# Patient Record
Sex: Male | Born: 1940 | Race: White | Hispanic: No | Marital: Married | Smoking: Current every day smoker
Health system: Southern US, Community
[De-identification: ages and names within clinical notes are randomized; demographics above are authoritative.]

## PROBLEM LIST (undated history)

## (undated) DIAGNOSIS — R05 Cough: Secondary | ICD-10-CM

## (undated) DIAGNOSIS — J449 Chronic obstructive pulmonary disease, unspecified: Secondary | ICD-10-CM

## (undated) DIAGNOSIS — I10 Essential (primary) hypertension: Secondary | ICD-10-CM

## (undated) DIAGNOSIS — C3490 Malignant neoplasm of unspecified part of unspecified bronchus or lung: Secondary | ICD-10-CM

## (undated) DIAGNOSIS — K219 Gastro-esophageal reflux disease without esophagitis: Secondary | ICD-10-CM

## (undated) DIAGNOSIS — M1990 Unspecified osteoarthritis, unspecified site: Secondary | ICD-10-CM

## (undated) DIAGNOSIS — R059 Cough, unspecified: Secondary | ICD-10-CM

## (undated) DIAGNOSIS — N400 Enlarged prostate without lower urinary tract symptoms: Secondary | ICD-10-CM

## (undated) DIAGNOSIS — J45909 Unspecified asthma, uncomplicated: Secondary | ICD-10-CM

## (undated) HISTORY — PX: TONSILLECTOMY: SUR1361

## (undated) HISTORY — PX: APPENDECTOMY: SHX54

## (undated) HISTORY — PX: EYE SURGERY: SHX253

---

## 2012-04-20 ENCOUNTER — Emergency Department: Payer: Self-pay | Admitting: Emergency Medicine

## 2012-04-20 LAB — COMPREHENSIVE METABOLIC PANEL
Co2: 30 mmol/L (ref 21–32)
EGFR (African American): >60
EGFR (Non-African Amer.): >60
Glucose: 111 mg/dL — ABNORMAL HIGH (ref 65–99)
Osmolality: 290 (ref 275–301)
Potassium: 4.7 mmol/L (ref 3.5–5.1)
SGPT (ALT): 25 U/L (ref 12–78)
Sodium: 141 mmol/L (ref 136–145)
Total Protein: 6.6 g/dL (ref 6.4–8.2)

## 2012-04-20 LAB — PROTIME-INR: Prothrombin Time: 13.2 secs (ref 11.5–14.7)

## 2012-04-20 LAB — CBC
HCT: 41.9 % (ref 40.0–52.0)
HGB: 14.1 g/dL (ref 13.0–18.0)
RBC: 4.49 10*6/uL (ref 4.40–5.90)

## 2012-04-20 LAB — LIPASE, BLOOD: Lipase: 165 U/L

## 2012-04-21 ENCOUNTER — Inpatient Hospital Stay: Payer: Self-pay | Admitting: Internal Medicine

## 2012-04-21 LAB — CBC
HCT: 35.4 % — ABNORMAL LOW (ref 40.0–52.0)
HGB: 11.9 g/dL — ABNORMAL LOW (ref 13.0–18.0)
MCH: 31.7 pg (ref 26.0–34.0)
MCV: 94 fL (ref 80–100)
Platelet: 264 10*3/uL (ref 150–440)
RDW: 12.6 % (ref 11.5–14.5)

## 2012-04-21 LAB — COMPREHENSIVE METABOLIC PANEL
Anion Gap: 6 — ABNORMAL LOW (ref 7–16)
BUN: 41 mg/dL — ABNORMAL HIGH (ref 7–18)
Co2: 25 mmol/L (ref 21–32)
Creatinine: 1.06 mg/dL (ref 0.60–1.30)
EGFR (African American): >60
EGFR (Non-African Amer.): >60
Glucose: 91 mg/dL (ref 65–99)
Osmolality: 291 (ref 275–301)
SGOT(AST): 31 U/L (ref 15–37)
Sodium: 141 mmol/L (ref 136–145)
Total Protein: 6.6 g/dL (ref 6.4–8.2)

## 2012-04-21 LAB — URINALYSIS, COMPLETE
Bacteria: NONE SEEN
Blood: NEGATIVE
Glucose,UR: NEGATIVE mg/dL (ref 0–75)
Leukocyte Esterase: NEGATIVE
Nitrite: NEGATIVE
Ph: 5 (ref 4.5–8.0)
Protein: NEGATIVE
RBC,UR: 1 /HPF (ref 0–5)
Specific Gravity: 1.01 (ref 1.003–1.030)
WBC UR: 1 /HPF (ref 0–5)

## 2012-04-21 LAB — LIPASE, BLOOD: Lipase: 170 U/L (ref 73–393)

## 2012-04-21 LAB — PROTIME-INR
INR: 1
Prothrombin Time: 13.7 secs (ref 11.5–14.7)

## 2012-04-21 LAB — HEMOGLOBIN: HGB: 10.2 g/dL — ABNORMAL LOW (ref 13.0–18.0)

## 2012-04-22 LAB — COMPREHENSIVE METABOLIC PANEL
BUN: 30 mg/dL — ABNORMAL HIGH (ref 7–18)
Bilirubin,Total: 0.4 mg/dL (ref 0.2–1.0)
Co2: 25 mmol/L (ref 21–32)
EGFR (African American): >60
Glucose: 85 mg/dL (ref 65–99)
Osmolality: 292 (ref 275–301)
Potassium: 4.2 mmol/L (ref 3.5–5.1)
SGOT(AST): 20 U/L (ref 15–37)
Sodium: 144 mmol/L (ref 136–145)

## 2012-04-22 LAB — AMMONIA: Ammonia, Plasma: <25 mcmol/L (ref 11–32)

## 2012-04-22 LAB — MAGNESIUM: Magnesium: 2.1 mg/dL

## 2012-04-22 LAB — HEMOGLOBIN
HGB: 9.3 g/dL — ABNORMAL LOW (ref 13.0–18.0)
HGB: 7.5 g/dL — ABNORMAL LOW (ref 13.0–18.0)

## 2012-04-23 LAB — CBC WITH DIFFERENTIAL/PLATELET
Basophil #: 0.1 10*3/uL (ref 0.0–0.1)
Basophil %: 0.8 %
Eosinophil #: 0.5 10*3/uL (ref 0.0–0.7)
Eosinophil #: 0.7 10*3/uL (ref 0.0–0.7)
Eosinophil %: 4.2 %
HCT: 26.3 % — ABNORMAL LOW (ref 40.0–52.0)
HGB: 7.5 g/dL — ABNORMAL LOW (ref 13.0–18.0)
HGB: 8.9 g/dL — ABNORMAL LOW (ref 13.0–18.0)
Lymphocyte #: 2.2 10*3/uL (ref 1.0–3.6)
Lymphocyte #: 2 10*3/uL (ref 1.0–3.6)
MCH: 29.6 pg (ref 26.0–34.0)
MCH: 29.7 pg (ref 26.0–34.0)
MCHC: 33.4 g/dL (ref 32.0–36.0)
MCHC: 34 g/dL (ref 32.0–36.0)
MCV: 88 fL (ref 80–100)
Monocyte #: 0.7 x10 3/mm (ref 0.2–1.0)
Monocyte #: 0.9 x10 3/mm (ref 0.2–1.0)
Monocyte %: 6.6 %
Neutrophil #: 7.8 10*3/uL — ABNORMAL HIGH (ref 1.4–6.5)
Platelet: 175 10*3/uL (ref 150–440)
Platelet: 163 10*3/uL (ref 150–440)
RBC: 3.01 10*6/uL — ABNORMAL LOW (ref 4.40–5.90)
RDW: 16 % — ABNORMAL HIGH (ref 11.5–14.5)
WBC: 11.3 10*3/uL — ABNORMAL HIGH (ref 3.8–10.6)
WBC: 12.8 10*3/uL — ABNORMAL HIGH (ref 3.8–10.6)

## 2012-04-23 LAB — BASIC METABOLIC PANEL
BUN: 30 mg/dL — ABNORMAL HIGH (ref 7–18)
Co2: 23 mmol/L (ref 21–32)
Creatinine: 0.84 mg/dL (ref 0.60–1.30)
EGFR (African American): >60
EGFR (Non-African Amer.): >60
Glucose: 107 mg/dL — ABNORMAL HIGH (ref 65–99)
Osmolality: 301 (ref 275–301)
Sodium: 148 mmol/L — ABNORMAL HIGH (ref 136–145)

## 2012-04-24 LAB — CBC WITH DIFFERENTIAL/PLATELET
Basophil %: 0.8 %
Eosinophil #: 0.7 10*3/uL (ref 0.0–0.7)
Eosinophil %: 5.5 %
Eosinophil %: 5 %
HCT: 24.2 % — ABNORMAL LOW (ref 40.0–52.0)
HCT: 28.4 % — ABNORMAL LOW (ref 40.0–52.0)
HGB: 9.8 g/dL — ABNORMAL LOW (ref 13.0–18.0)
Lymphocyte #: 1.5 10*3/uL (ref 1.0–3.6)
Lymphocyte #: 2.2 10*3/uL (ref 1.0–3.6)
Lymphocyte %: 16 %
MCH: 28.9 pg (ref 26.0–34.0)
MCH: 30.1 pg (ref 26.0–34.0)
MCHC: 34.7 g/dL (ref 32.0–36.0)
MCV: 87 fL (ref 80–100)
Monocyte #: 0.9 x10 3/mm (ref 0.2–1.0)
Monocyte #: 1.1 x10 3/mm — ABNORMAL HIGH (ref 0.2–1.0)
Monocyte %: 7.6 %
Neutrophil #: 8.7 10*3/uL — ABNORMAL HIGH (ref 1.4–6.5)
Neutrophil #: 9.5 10*3/uL — ABNORMAL HIGH (ref 1.4–6.5)
Neutrophil %: 73.1 %
Neutrophil %: 70.1 %
Platelet: 161 10*3/uL (ref 150–440)
RBC: 2.79 10*6/uL — ABNORMAL LOW (ref 4.40–5.90)
RDW: 16 % — ABNORMAL HIGH (ref 11.5–14.5)
RDW: 15.5 % — ABNORMAL HIGH (ref 11.5–14.5)
WBC: 11.9 10*3/uL — ABNORMAL HIGH (ref 3.8–10.6)
WBC: 13.5 10*3/uL — ABNORMAL HIGH (ref 3.8–10.6)

## 2012-04-24 LAB — BASIC METABOLIC PANEL
Anion Gap: 5 — ABNORMAL LOW (ref 7–16)
BUN: 22 mg/dL — ABNORMAL HIGH (ref 7–18)
Chloride: 120 mmol/L — ABNORMAL HIGH (ref 98–107)
Co2: 23 mmol/L (ref 21–32)
Creatinine: 0.77 mg/dL (ref 0.60–1.30)
EGFR (African American): >60
EGFR (Non-African Amer.): >60
Glucose: 86 mg/dL (ref 65–99)
Osmolality: 297 (ref 275–301)
Potassium: 3.8 mmol/L (ref 3.5–5.1)

## 2012-04-24 LAB — HEMOGLOBIN
HGB: 8.5 g/dL — ABNORMAL LOW (ref 13.0–18.0)
HGB: 9.8 g/dL — ABNORMAL LOW (ref 13.0–18.0)

## 2012-04-25 LAB — BASIC METABOLIC PANEL
BUN: 13 mg/dL (ref 7–18)
Calcium, Total: 7.4 mg/dL — ABNORMAL LOW (ref 8.5–10.1)
Chloride: 113 mmol/L — ABNORMAL HIGH (ref 98–107)
Creatinine: 0.79 mg/dL (ref 0.60–1.30)
EGFR (African American): >60
Glucose: 84 mg/dL (ref 65–99)
Osmolality: 286 (ref 275–301)

## 2012-04-25 LAB — CBC WITH DIFFERENTIAL/PLATELET
Eosinophil #: 0.5 10*3/uL (ref 0.0–0.7)
Eosinophil %: 4.7 %
HCT: 26.6 % — ABNORMAL LOW (ref 40.0–52.0)
Lymphocyte %: 13 %
MCH: 29.7 pg (ref 26.0–34.0)
MCHC: 34.2 g/dL (ref 32.0–36.0)
MCV: 87 fL (ref 80–100)
Neutrophil %: 72.3 %
Platelet: 171 10*3/uL (ref 150–440)
RBC: 3.07 10*6/uL — ABNORMAL LOW (ref 4.40–5.90)
RDW: 15.2 % — ABNORMAL HIGH (ref 11.5–14.5)
WBC: 11.2 10*3/uL — ABNORMAL HIGH (ref 3.8–10.6)

## 2012-04-25 LAB — HEMOGLOBIN
HGB: 9.3 g/dL — ABNORMAL LOW (ref 13.0–18.0)
HGB: 9 g/dL — ABNORMAL LOW (ref 13.0–18.0)

## 2012-04-26 LAB — HEMOGLOBIN: HGB: 9.2 g/dL — ABNORMAL LOW (ref 13.0–18.0)

## 2012-06-04 ENCOUNTER — Ambulatory Visit: Payer: Self-pay | Admitting: Gastroenterology

## 2013-02-02 ENCOUNTER — Ambulatory Visit: Payer: Self-pay | Admitting: Ophthalmology

## 2013-02-02 DIAGNOSIS — I499 Cardiac arrhythmia, unspecified: Secondary | ICD-10-CM

## 2013-02-15 ENCOUNTER — Ambulatory Visit: Payer: Self-pay | Admitting: Ophthalmology

## 2014-04-01 DIAGNOSIS — H01009 Unspecified blepharitis unspecified eye, unspecified eyelid: Secondary | ICD-10-CM | POA: Diagnosis not present

## 2014-06-07 DIAGNOSIS — I10 Essential (primary) hypertension: Secondary | ICD-10-CM | POA: Diagnosis not present

## 2014-06-07 DIAGNOSIS — G500 Trigeminal neuralgia: Secondary | ICD-10-CM | POA: Diagnosis not present

## 2014-06-07 DIAGNOSIS — G44009 Cluster headache syndrome, unspecified, not intractable: Secondary | ICD-10-CM | POA: Diagnosis not present

## 2014-06-13 DIAGNOSIS — M542 Cervicalgia: Secondary | ICD-10-CM | POA: Diagnosis not present

## 2014-06-13 DIAGNOSIS — B028 Zoster with other complications: Secondary | ICD-10-CM | POA: Diagnosis not present

## 2014-06-23 DIAGNOSIS — K279 Peptic ulcer, site unspecified, unspecified as acute or chronic, without hemorrhage or perforation: Secondary | ICD-10-CM | POA: Diagnosis not present

## 2014-06-23 DIAGNOSIS — J209 Acute bronchitis, unspecified: Secondary | ICD-10-CM | POA: Diagnosis not present

## 2014-06-23 DIAGNOSIS — M542 Cervicalgia: Secondary | ICD-10-CM | POA: Diagnosis not present

## 2014-06-23 DIAGNOSIS — M47812 Spondylosis without myelopathy or radiculopathy, cervical region: Secondary | ICD-10-CM | POA: Diagnosis not present

## 2014-07-08 NOTE — Consult Note (Signed)
Chief Complaint:   Subjective/Chief Complaint Covering for Dr. Gustavo Lah. No abd pain or melena/hematochezia but c/o feeling weak and hungry and wanting to go home.   VITAL SIGNS/ANCILLARY NOTES: **Vital Signs.:   08-Feb-14 04:54   Vital Signs Type Q 4hr   Temperature Temperature (F) 99   Celsius 37.2   Temperature Source Oral   Pulse Pulse 90   Respirations Respirations 18   Systolic BP Systolic BP 295   Diastolic BP (mmHg) Diastolic BP (mmHg) 71   Mean BP 90   Pulse Ox % Pulse Ox % 96   Pulse Ox Activity Level  At rest   Oxygen Delivery Room Air/ 21 %   Brief Assessment:   Cardiac Regular    Respiratory clear BS    Gastrointestinal Normal   Lab Results: Routine Chem:  08-Feb-14 04:40    Glucose, Serum 84   BUN 13   Creatinine (comp) 0.79   Sodium, Serum 144   Potassium, Serum 3.5   Chloride, Serum  113   CO2, Serum 21   Calcium (Total), Serum  7.4   Anion Gap 10   Osmolality (calc) 286   eGFR (African American) >60   eGFR (Non-African American) >60 (eGFR values <36m/min/1.73 m2 may be an indication of chronic kidney disease (CKD). Calculated eGFR is useful in patients with stable renal function. The eGFR calculation will not be reliable in acutely ill patients when serum creatinine is changing rapidly. It is not useful in  patients on dialysis. The eGFR calculation may not be applicable to patients at the low and high extremes of body sizes, pregnant women, and vegetarians.)  Routine Hem:  08-Feb-14 04:40    WBC (CBC)  11.2   RBC (CBC)  3.07   Hemoglobin (CBC)  9.1   Hematocrit (CBC)  26.6   Platelet Count (CBC) 171   MCV 87   MCH 29.7   MCHC 34.2   RDW  15.2   Neutrophil % 72.3   Lymphocyte % 13.0   Monocyte % 9.3   Eosinophil % 4.7   Basophil % 0.7   Neutrophil #  8.1   Lymphocyte # 1.5   Monocyte # 1.0   Eosinophil # 0.5   Basophil # 0.1 (Result(s) reported on 25 Apr 2012 at 05:32AM.)   Assessment/Plan:  Assessment/Plan:   Assessment  Likely duodenal ulcer.    Plan Agree with clear liquid. Pt aware that bleeding can recur as diet is advanced. Moniter hgb closely and continue PPI. thanks.   Electronic Signatures: OVerdie Shire(MD)  (Signed 08-Feb-14 09:36)  Authored: Chief Complaint, VITAL SIGNS/ANCILLARY NOTES, Brief Assessment, Lab Results, Assessment/Plan   Last Updated: 08-Feb-14 09:36 by OVerdie Shire(MD)

## 2014-07-08 NOTE — Consult Note (Signed)
Chief Complaint:   Subjective/Chief Complaint Patient seen and examined, please see full GI consult and consult note.  Patient presenting with n/v coffeground emesis and black stools.   He had noticed an increase of heartburn symptoms over the past couple weeks, black stool started sunday.  Last emesis was yesterday am, last black stool this pm.  no abdominal pain but generalized discomfort.  Positive ASA and etoh use.  Labs do not reflect cirrhosis, though this is of some concern in the clinical history.  Continue iv ppi as you are npo, serial hgb, transfuse as needed.  Will arrange for egd tomorrow pm, will need anesthesia assistance. I have discussed the risks benefits and complicatons of egd to include not limited to bleeding infection perforation and sedation and he wishes to proceed.   Electronic Signatures: Loistine Simas (MD)  (Signed 04-Feb-14 20:25)  Authored: Chief Complaint   Last Updated: 04-Feb-14 20:25 by Loistine Simas (MD)

## 2014-07-08 NOTE — Consult Note (Signed)
Brief Consult Note: Diagnosis: GI bleed.   Patient was seen by consultant.   Consult note dictated.   Comments: Appreciate consult for 74 y/o caucasian man with history of PUD 1990s, chronic tobacco and etoh use, admitted with likely GI bleed, for evaluation of the same. Patient states he was in his usual state of health until Sun night after pork barbeque dinner- started feeling unwell. Monday am woke up and passed a black stool, vomited some dark brown material that looked like coffee grounds.  States he has passed several more black stools since then. Feels fatigued and weak. [facility] admission yesterday, but agreed today.  States that he has some discomfort above the umbilicus, but denies pain. Denies nausea, heartburn, reflux, problems swallowing, and all further GI complaints. States that he started taking Pepcid yesterday after the black stool. Took and aspirin 3d ago, but denies other use of NSAIDs. Noted 2.5g hgb drop since yesterday, platelets normal, pt/inr normal. Endorses 2-3 alcoholic beverages/d. Thinks he had a colonoscopy here a couple of years ago, I do not see this record in [facility]. Had remote EGD 1990s with episode of peptic ulcer. On exam, his abdomen is soft and nontender. There is no guarding or rebound tenderness.  Impression and plan: Hematemesis and melena. Concering for UGI bleeding. Agree with serial hemoglobins and Pantoprazole gtt. Would transfuse as required. Additionally will plan for EGD tomorrow pm a clinically feasible. Indications, risks, benefits discussed with patient and he is agreeable..  Electronic Signatures: Stephens November H (NP)  (Signed 04-Feb-14 17:18)  Authored: Brief Consult Note   Last Updated: 04-Feb-14 17:18 by Theodore Demark (NP)

## 2014-07-08 NOTE — H&P (Signed)
ADDENDUM  PATIENT NAME:  James Faulkner, James Faulkner MR#:  [id number] DATE OF BIRTH:  12-04-1940  DATE OF ADMISSION:  04/20/2012  The patient is a smoker. Gave him smoking cessation counseling for about 4r minutes. The patient actually decided to leave AMA and I talked to him for a long time about staying and being observed. The patient decided to go and try to find his primary care physician to send him to do a colonoscopy and EGD. The patient understands the risk of going home with his active GI bleeding. He cannot be stopped at this moment.   TIME SPENT: I spent about 55 minutes with patient.  ____________________________ [place] Sink, MD rsg:aw D: 04/20/2012 21:31:48 ET T: 04/21/2012 08:01:06 ET JOB#: [id number]  cc: [place] Sink, MD, <Dictator> Jakyron Fabro America Brown MD ELECTRONICALLY SIGNED 04/28/2012 13:28

## 2014-07-08 NOTE — Op Note (Signed)
PATIENT NAME:  James Faulkner, James Faulkner MR#:  [id number] DATE OF BIRTH:  Jun 27, 1940  DATE OF PROCEDURE:  02/15/2013  PREOPERATIVE DIAGNOSIS:  Cataract, left eye.    POSTOPERATIVE DIAGNOSIS:  Cataract, left eye.  PROCEDURE PERFORMED: Extracapsular cataract extraction using phacoemulsification with placement of Alcon [id number], 24.0-diopter posterior chamber lens, serial number [id number].   SURGEON:  Loura Back. Kashvi Prevette, MD  ASSISTANT:  None.  ANESTHESIA: Was 4% lidocaine and 0.75% Marcaine in a 50:50 mixture with 10 units units/mL of Hylenex added, given as a peribulbar.   ANESTHESIOLOGIST: Dr. [place] Service.   COMPLICATIONS:  None.  ESTIMATED BLOOD LOSS:  Less than 1 ml.  DESCRIPTION OF PROCEDURE:  The patient was brought to the operating room and given a peribulbar block.  The patient was then prepped and draped in the usual fashion.  The vertical rectus muscles were imbricated using 5-0 silk sutures.  These sutures were then clamped to the sterile drapes as bridle sutures.  A limbal peritomy was performed extending two clock hours and hemostasis was obtained with cautery.  A partial thickness scleral groove was made at the surgical limbus and dissected anteriorly in a lamellar dissection using an Alcon crescent knife.  The anterior chamber was entered supero-temporally with a Superblade and through the lamellar dissection with a 2.6 mm keratome.  DisCoVisc was used to replace the aqueous and a continuous tear capsulorrhexis was carried out.  Hydrodissection and hydrodelineation were carried out with balanced salt and a 27 gauge canula.  The nucleus was rotated to confirm the effectiveness of the hydrodissection.  Phacoemulsification was carried out using a divide-and-conquer technique.  Total ultrasound time was 1 minute and 50 seconds with an average power of 25.2 percent. CDE of 47.51. No suture was placed.   Irrigation/aspiration was used to remove the residual cortex.  DisCoVisc was used to  inflate the capsule and the internal incision was enlarged to 3 mm with the crescent knife.  The intraocular lens was folded and inserted into the capsular bag using the AcrySert delivery system.  Irrigation/aspiration was used to remove the residual DisCoVisc.  Miostat was injected into the anterior chamber through the paracentesis track to inflate the anterior chamber and induce miosis. A tenth of a milliliter of cefuroxime was injected via the paracentesis track containing 1 mg of drug. The wound was checked for leaks and none were found. The conjunctiva was closed with cautery and the bridle sutures were removed.  Two drops of 0.3% Vigamox were placed on the eye.   An eye shield was placed on the eye.  The patient was discharged to the recovery room in good condition.   ____________________________ Loura Back Lakeyia Surber, MD sad:np D: 02/15/2013 13:27:49 ET T: 02/15/2013 15:35:31 ET JOB#: [id number]  cc: Remo Lipps A. Morgann Woodburn, MD, <Dictator> Martie Lee MD ELECTRONICALLY SIGNED 02/22/2013 8:34

## 2014-07-08 NOTE — Consult Note (Signed)
Chief Complaint:   Subjective/Chief Complaint seen for gi bleeding. Patient with several bm, black since yesterday, some decrease of hgb.  no emesis.  Patient partly sedated sue to CIWA protocol.  Denies nausea or abdominal pain. No emesis. Currently being transfused.   VITAL SIGNS/ANCILLARY NOTES: **Vital Signs.:   06-Feb-14 14:22   Vital Signs Type Blood Transfusion Complete   Temperature Temperature (F) 98.4   Celsius 36.8   Temperature Source oral   Pulse Pulse 80   Respirations Respirations 14   Systolic BP Systolic BP 287   Diastolic BP (mmHg) Diastolic BP (mmHg) 60   Mean BP 75   Pulse Ox % Pulse Ox % 96   Brief Assessment:   Cardiac Regular    Respiratory clear BS    Gastrointestinal details normal Soft  Nontender  Nondistended  No masses palpable  Bowel sounds normal   Lab Results: Routine Chem:  06-Feb-14 04:21    Glucose, Serum  107   BUN  30   Creatinine (comp) 0.84   Sodium, Serum  148   Potassium, Serum 4.1   Chloride, Serum  120   CO2, Serum 23   Calcium (Total), Serum  7.3   Anion Gap  5   Osmolality (calc) 301   eGFR (African American) >60   eGFR (Non-African American) >60 (eGFR values <18m/min/1.73 m2 may be an indication of chronic kidney disease (CKD). Calculated eGFR is useful in patients with stable renal function. The eGFR calculation will not be reliable in acutely ill patients when serum creatinine is changing rapidly. It is not useful in  patients on dialysis. The eGFR calculation may not be applicable to patients at the low and high extremes of body sizes, pregnant women, and vegetarians.)  Routine Hem:  04-Feb-14 18:21    Hemoglobin (CBC)  10.2 (Result(s) reported on 21 Apr 2012 at 07:12PM.)  05-Feb-14 01:30    Hemoglobin (CBC)  9.3 (Result(s) reported on 22 Apr 2012 at 02:07AM.)    09:55    Hemoglobin (CBC)  7.5 (Result(s) reported on 22 Apr 2012 at 10:12AM.)    21:56    Hemoglobin (CBC)  8.8 (Result(s) reported on 22 Apr 2012 at  10:09PM.)  06-Feb-14 04:21    WBC (CBC)  11.3   RBC (CBC)  2.53   Hemoglobin (CBC)  7.5   Hematocrit (CBC)  22.4   Platelet Count (CBC) 175   MCV 88   MCH 29.6   MCHC 33.4   RDW  17.6   Neutrophil % 69.3   Lymphocyte % 19.1   Monocyte % 6.6   Eosinophil % 4.2   Basophil % 0.8   Neutrophil #  7.8   Lymphocyte # 2.2   Monocyte # 0.7   Eosinophil # 0.5   Basophil # 0.1 (Result(s) reported on 23 Apr 2012 at 06:27AM.)   Assessment/Plan:  Assessment/Plan:   Assessment 1) upper gi bleeding due to large duodenal ulcer.  could not approach well on egd due to local edema and area of the C-loop affected.  Hemodynamically stable.  Rectal showing currently a thin watery/granular black effluent.    Plan 1) awaiting post-transfusion hgb.  Will discuss further with surgery.  Continue current.   Electronic Signatures: SLoistine Simas(MD)  (Signed 06-Feb-14 14:36)  Authored: Chief Complaint, VITAL SIGNS/ANCILLARY NOTES, Brief Assessment, Lab Results, Assessment/Plan   Last Updated: 06-Feb-14 14:36 by SLoistine Simas(MD)

## 2014-07-08 NOTE — H&P (Signed)
PATIENT NAME:  James Faulkner, James Faulkner MR#:  [id number] DATE OF BIRTH:  29-May-1940  DATE OF ADMISSION:  04/20/2012  REFERRING PHYSICIAN: Dr. Loletta Specter PRIMARY CARE PHYSICIAN: Dr. Lorelee Market    CHIEF COMPLAINT: Coffee-ground emesis x 2, large melanotic bowel movements x 3.   HISTORY OF PRESENT ILLNESS: The patient is a 74 year old gentleman with a history of peptic ulcer disease. Apparently, he had a severe GI bleeding episode in the mid 90s when he was transfused several units. He was very sick, in shock, but the problem resolved and he has not had any trouble since then. He sees Dr. Brunetta Genera. He is overall healthy. He is a drinker and a smoker. He has been told that he has an enlarged heart, although there has not been any work-up done for this. The patient comes today with a history of eating ribs yesterday, lots of peanuts and/or irritative foods  and alcohol yesterday for the Super Bowl.  This morning at 6:00 a.m. he woke up, he felt very dizzy, lightheaded, very weak, and went to the bathroom and had a bowel movement; and it was dark black blood mixed with some red blood. He has had 3 bowel movements so far that look the same, and they have been very large. He had coffee-ground emesis x 2 this morning. I had a long conversation with the patient.  The patient states that he would like to go home because he is not wanting to have any procedures right now.  He will follow up with his doctor for this. I recommended him to stay due to the fact that he had very large bowel movements with a lot of blood on it, and he had a history of peptic ulcer disease in the past which required multiple transfusions of blood, and the patient was in hemorrhagic shock. The patient states that he was going to think about staying and talk to his wife about it.   REVIEW OF SYSTEMS: A 12-system review is done.  CONSTITUTIONAL: Denies any fever. Positive fatigue. Positive weakness this morning. No significant weight loss or  weight gain.  EYES: No blurry vision. No double vision. No erythema.  ENT: No tinnitus. No ear pain. No postnasal drip. No sinus pain.  RESPIRATORY: No cough. No wheezing. No hemoptysis. No pneumonia.   CARDIOVASCULAR: The patient has been told that he has an enlarged heart.  The  reason that he knows that is because of a test.  I assume it was an EKG done at the office of Dr. Brunetta Genera. The patient consulted with him because of having right-sided chest soreness which he states was related to the patient using a power saw for several days at home. He was just tender, and the pain seems to start getting better. No orthopnea, no edema. No palpitations, no syncope.  GASTROINTESTINAL: Positive vomiting. Positive nausea.  Positive melena. History of  peptic ulcer disease. No GERD. No jaundice. No constipation. He has been having normal bowel movements prior to today.  GENITOURINARY: No dysuria, hematuria or changes in frequency.  ENDOCRINE: No polyuria, polydipsia, or polyphagia. No cold or heat intolerance. HEMATOLOGIC/LYMPHATIC: No previous easy bruising, anemia, or significant swollen glands.  SKIN: Without any rashes or lesions.  MUSCULOSKELETAL: No significant neck pain, back pain.  He does have shoulder pain on the right side and chest wall pain. No swollen joints. No gout.  NEUROLOGICAL: No numbness. No tingling. No significant memory loss. No CVAs. No TIAs.  PSYCHIATRIC: No insomnia, depression, or nervousness.  PAST MEDICAL HISTORY:  1. As mentioned above, possible enlarged heart.  He has been told this by his primary care physician.  2. Peptic ulcer disease in the 90s with multiple transfusions due to GI bleeding.  3. The patient states that he is healthy otherwise.   ALLERGIES: SULFA.   PAST SURGICAL HISTORY: The patient denies any surgical interventions, although on his record it states that he had an appendectomy in 2005.   FAMILY HISTORY: His mom is healthy and alive, and she is 64. No  cancer. No coronary artery disease in the family.   SOCIAL HISTORY: The patient is a smoker. He smokes 1 pack a day for over 60 years. He denies having COPD or lung problems. He is a drinker. He drinks 2 to 3 drinks a day in the afternoon. He denies ever having DTs. He is a retired Paramedic.   CURRENT MEDICATIONS: Vitamin C once daily and Pepcid AC 2 times daily.   PHYSICAL EXAMINATION: VITAL SIGNS: Blood pressure 137/68, pulse is 106, respirations 24, temperature 98.2, oxygen saturation 96% on room air.  GENERAL: The patient is alert, oriented x 3. No acute distress, no respiratory distress. Hemodynamically, he is stable.  HEENT: Pupils are equal and reactive. Extraocular movements are intact. Mucosa are moist. No icterus.  NECK: Supple. No JVD. No thyromegaly. No adenopathy. No masses.  CARDIOVASCULAR: Regular rate and rhythm, slightly tachycardic. No murmurs, rubs, or gallops. No tenderness to palpation of anterior chest wall at this moment. No displacement of PMI.  LUNGS: Clear without any wheezing or crepitus. No use of accessory muscles.  ABDOMEN: Soft, nontender, nondistended. No hepatosplenomegaly. No masses. Bowel sounds are positive. It is slightly distended and tympanic but no guarding.  GENITAL: Negative for external lesions.  EXTREMITIES: No edema, no cyanosis, no clubbing.  RECTAL: Positive guaiac on rectal exam by ER physician. I did not do a rectal exam myself.  EXTREMITIES: No edema, no cyanosis, no clubbing. Pulses +2. Capillary refill around 3 seconds.  PSYCHIATRIC: Mood is normal without any signs of significant agitation or depression.  NEUROLOGICAL: Cranial nerves II through XII intact. No focal deficits.  LYMPHATIC: Negative for lymphadenopathy in the neck or supraclavicular areas.  SKIN: Without any rashes or petechiae.   LABORATORY, DIAGNOSTIC AND RADIOLOGICAL DATA:  Glucose 111, BUN 35, sodium is 141, potassium is 4.7. His LFTs are normal. His white  count is 13.8, his hemoglobin is 14, hematocrit 41, INR is 1.0.   EKG: Normal sinus rhythm. No ST depression or elevation. No signs of LVH or chamber enlargement.   ASSESSMENT AND PLAN: This is a 74 year old gentleman with history of peptic ulcer disease and GI bleeding.   1. Melena with gastrointestinal bleeding: The patient had 3 bowel movements with a large amount of black stool and some mixed red vomiting, coffee-ground emesis. The patient is doing okay, hemodynamically stable. His hemoglobin is 14. He used to smoke here for which possibly his hemoglobin runs high anyway. He is not getting orthostatic right now, but he had significant history of bleeding before with hypovolemic shock for which we recommended to stay overnight and be at least watched. The patient does not want to do that. He wants to leave AMA. I am trying to convince him to stay. I am putting in orders for admission. The patient is considering staying.  GI consultation.  Protonix b.i.d. IV. IV fluids given. The patient is not taking any NSAIDs or aspirin or blood thinners.  2. Alcohol abuse:  The patient drinks significantly. We are going to order CIWA protocol if the patient decides to stay.  3. Increased white blood cells: This could be also related to possible diverticulitis. The patient ate a lot of peanuts and then started bleeding, might be related to the diverticular bleeding. At this moment, there are no other signs of infection; but it could be also due to stress. The patient is afebrile, and he does not have any significant abdominal pain for which I am not going to treat him with antibiotics.  We are just going to observe and wait for a GI consultation for now.   Other than that, the patient is stable but at risk of severe decompensation due to GI bleeding.  Admission orders are put in, although the patient might leave AMA.    CODE STATUS: The patient is a FULL CODE.   TIME SPENT:   About 50 minutes with this  patient. ____________________________ [place] Sink, MD rsg:cb D: 04/20/2012 15:54:46 ET T: 04/20/2012 17:02:22 ET JOB#: [id number]  cc: [place] Sink, MD, <Dictator> Myisha Pickerel America Brown MD ELECTRONICALLY SIGNED 04/28/2012 13:26

## 2014-07-08 NOTE — Consult Note (Signed)
Chief Complaint:   Subjective/Chief Complaint seen for gi bleed.  stable overnight several bloody/watery stools overnight, no abdominal pain no n/v.  patient states he is feeling better today.   VITAL SIGNS/ANCILLARY NOTES: **Vital Signs.:   07-Feb-14 07:00   Vital Signs Type Routine   Temperature Temperature (F) 98.3   Celsius 36.8   Temperature Source oral   Pulse Pulse 78   Systolic BP Systolic BP 979   Diastolic BP (mmHg) Diastolic BP (mmHg) 60   Mean BP 79   Pulse Ox % Pulse Ox % 97   Oxygen Delivery Room Air/ 21 %   Pulse Ox Heart Rate 80  *Intake and Output.:   07-Feb-14 01:11   Stool  1 large liquid stool   Brief Assessment:   Cardiac Regular    Respiratory clear BS    Gastrointestinal details normal Soft  Nontender  Nondistended  No masses palpable  Bowel sounds normal   Lab Results: Routine Chem:  06-Feb-14 04:21    BUN  30  07-Feb-14 02:42    Glucose, Serum 86   BUN  22   Creatinine (comp) 0.77   Sodium, Serum  148   Potassium, Serum 3.8   Chloride, Serum  120   CO2, Serum 23   Calcium (Total), Serum  7.3   Anion Gap  5   Osmolality (calc) 297   eGFR (African American) >60   eGFR (Non-African American) >60 (eGFR values <34m/min/1.73 m2 may be an indication of chronic kidney disease (CKD). Calculated eGFR is useful in patients with stable renal function. The eGFR calculation will not be reliable in acutely ill patients when serum creatinine is changing rapidly. It is not useful in  patients on dialysis. The eGFR calculation may not be applicable to patients at the low and high extremes of body sizes, pregnant women, and vegetarians.)  Routine Hem:  05-Feb-14 21:56    Hemoglobin (CBC)  8.8 (Result(s) reported on 22 Apr 2012 at 10:09PM.)  06-Feb-14 04:21    Hemoglobin (CBC)  7.5    19:43    Hemoglobin (CBC)  8.9  07-Feb-14 02:42    Hemoglobin (CBC)  8.0   WBC (CBC)  11.9   RBC (CBC)  2.79   Hematocrit (CBC)  24.2   Platelet Count (CBC) 161    MCV 87   MCH 28.9   MCHC 33.2   RDW  16.0   Neutrophil % 73.1   Lymphocyte % 13.0   Monocyte % 7.6   Eosinophil % 5.5   Basophil % 0.8   Neutrophil #  8.7   Lymphocyte # 1.5   Monocyte # 0.9   Eosinophil # 0.7   Basophil # 0.1 (Result(s) reported on 24 Apr 2012 at 03:38AM.)    07:47    Hemoglobin (CBC)  8.5 (Result(s) reported on 24 Apr 2012 at 08:36AM.)   Assessment/Plan:  Assessment/Plan:   Assessment 1) upper gi bleed sue to duodenal ulcer.  very difficult to approach endoscopically, no active bleeding at that time, but recueernt bleeding since.  BUN inproved this am, no bm since 1 am.  hemodynamically stable.    Plan 1) will give one unit prbc.  will tentatively plan for egd this afternoon, however will need surgical availability.   Continue current.   Electronic Signatures: SLoistine Simas(MD)  (Signed 07-Feb-14 09:34)  Authored: Chief Complaint, VITAL SIGNS/ANCILLARY NOTES, Brief Assessment, Lab Results, Assessment/Plan   Last Updated: 07-Feb-14 09:34 by SLoistine Simas(MD)

## 2014-07-08 NOTE — H&P (Signed)
PATIENT NAME:  James Faulkner, James Faulkner MR#:  [id number] DATE OF BIRTH:  Oct 05, 1940  DATE OF ADMISSION:  04/21/2012  REFERRING PHYSICIAN: Graciella Freer, MD  REASON FOR ADMISSION: Recurrent GI bleeding.   HISTORY OF PRESENT ILLNESS: James Faulkner is a very nice 74 year old gentleman who has history of peptic ulcer disease. He has been seen here yesterday after an episode of coffee ground emesis x 2 and large melanotic bowel movements x 3, on the morning of 04/20/2012. The patient was actually admitted. I saw the patient and recommended to stay here in the hospital for observation of his hemoglobin and also for possible procedure, either EGD or other type of endoscopic procedure. The patient was really not willing to stay. He decided to go home against medical advice and today he comes again because of worsening of the problem. So yesterday he had the history of watching the Super Bowl the night before, having some grapes, peanuts and some other relative foods and alcohol. The patient in the morning, at 6:00 a.m., woke up, felt very dizzy, lightheaded, very weak, went to the bathroom and had a large bowel movement with dark blood mixed with some red blood. The patient had coffee ground emesis x 2 in the morning time as well. The patient was seen. His hemoglobin was 14, although he is a smoker and has been a chronic smoker for several years for what I assume that his hemoglobin runs much higher than that. He again went home against medical advice despite the fact that we had a long conversation telling him what the risk of doing that action could be, especially in the setting that the patient already had a GI bleeding episodes in the past, in the mid 90s, where he was severely requiring several transfusions of blood and he was very sick and in hemorrhagic shock. The patient, despite all of this, decided to go home. Today he comes because this morning and last night had another two large bowel movements with blood and dark  stool, mostly large melena with some tint of blood after staying on the toilet for a little while. The patient states that today he feels much weaker, much more lightheaded and much more dizzy. The patient is admitted today. His hemoglobin has dropped from 14.1 to 11.9. His blood pressure was 158/75 and his heart rate is much higher than yesterday, at 97. He had a low temp of 99. The patient had more pain on the left side of the abdomen too.   REVIEW OF SYSTEMS:  A 12-system review of systems is done. CONSTITUTIONAL: Denies any fever. Denies any significant weight loss or weight gain. Positive fatigue. Positive weakness.  EYES: No blurry vision. No double vision. No erythema.  ENT: No tinnitus. No postnasal drip. No ear pain. No sinus pain. No difficulty swallowing.  RESPIRATORY: No wheezing. No cough. No pneumonia. No hemoptysis.  CARDIOVASCULAR: The patient states that he had dizziness, lightheadedness. He denies any significant chest pain up until the last couple of weeks when he has been using a  power saw and he has been very tender on his chest, but there is mostly musculoskeletal pain. No palpitations. No syncope. No orthopnea.  GASTROINTESTINAL: Positive vomiting. Positive nausea. Positive melena. Positive history of peptic ulcer disease. No jaundice. No constipation.  GENITOURINARY: No dysuria, hematuria or changes in frequency. ENDOCRINE: No polyuria, polydipsia or polyphagia. No cold or heat intolerance. No thyroid problems.  HEMATOLOGIC/LYMPHATIC: No bruising. No anemia. No swollen glands.  SKIN: No rashes  or lesions.  MUSCULOSKELETAL: No joint pain. No swollen joints. No gout.  NEUROLOGICAL: No numbness. No tingling. No CVAs. No TIAs.  PSYCHIATRIC: No depression. No nervousness. No insomnia.   PAST MEDICAL HISTORY: 1. Peptic ulcer disease in the 90s.  2. Gastrointestinal bleeding in the 90s with severe hemorrhagic shock.  3. Otherwise the patient is healthy. He has been told he has  an enlarged heart but there is no evidence at this moment.   ALLERGIES: The patient is allergic to SULFA, gives a rash.   PAST SURGICAL HISTORY: No surgical interventions, although in the chart it has been noted that the patient has had an appendectomy. When the patient is asked, he said "oh yes, I did have one."  FAMILY HISTORY: His mom is 28, still alive. No cancer. No coronary artery disease. No neurologic problems in the family. No GI bleeding in the family.  SOCIAL HISTORY: The patient is a smoker. He has smoked 1 pack a day for over 60 years. He denies having any COPD or other lung problems. He is a drinker. He drinks 2 to 3 drinks every night. He denies any DTs in the past. 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   CURRENT MEDICATIONS: Vitamin C and Pepcid AC.    PHYSICAL EXAMINATION: VITAL SIGNS: Blood pressure 158/75, pulse 97, respiratory rate 20, and temperature of 99.  GENERAL: The patient is alert, oriented x 3, in no acute distress. No respiratory distress. Hemodynamically is stable. He looks more tired than he looked yesterday, more withdrawn. ENT: Pupils are equal and reactive. Extraocular movements are intact. Mucosa moist. Anicteric sclerae. Pale conjunctivae.  NECK: Supple. No JVD. No thyromegaly. No adenopathy. No masses. No rigidity.  HEART: Regular rate and rhythm. No murmurs, rubs or gallops are appreciated at this moment. No tenderness to palpation to anterior chest wall. No displacement of PMI.  LUNGS: Clear without any crepitus or wheezing. The patient is not using any accessory muscles.  ABDOMEN: Soft, nontender and nondistended. On the epigastric area, he has some mild tenderness at the level of the left quadrant but only with deep palpation. The patient states that it was not hurting much prior to that. The patient has a distended abdomen, tympanic, but no guarding or rebound.  GENITAL: Negative for external lesions.  RECTAL: Positive guaiac yesterday. We  did not repeat it today.  EXTREMITIES: No edema, no cyanosis and no clubbing. Pulses +2. Capillary refill less than 3 seconds.  PSYCHIATRIC: His mood is normal without any agitation, alert and oriented x 3.  NEUROLOGIC: Cranial nerves II through XII intact. No focal deficits.  LYMPHATIC: Negative for lymphadenopathy in neck or supraclavicular areas.  SKIN: No rashes or petechiae.  MUSCULOSKELETAL: No significant joint deformities or joint edema.   LABS/DIAGNOSTIC STUDIES: BUN 41, creatinine 1.06, chloride 110. Hemoglobin is 11.9 from 14.1 yesterday. White count is 13.6 and hematocrit 35. Red blood cells in urine are only 1 and white blood cells are 1.   EKG: Tachycardia. No ST depression or elevation.   ASSESSMENT AND PLAN:  1. Melena with gastrointestinal bleeding. As mentioned, the patient was here yesterday. There were admission orders done, but the patient decided to leave against medical advice despite long counseling. His hemoglobin was 14.1, today is 11.9. The patient is more dizzy, more lightheaded, more symptomatic. He is tachycardic today. This is likely to be related to peptic ulcer disease, but the patient had a lot of peanuts and could also be related to  diverticular bleeding. Yesterday he did not have any fever and today he had a slight fever around 99 and he had also elevated white blood count, a little bit higher than yesterday, at 13.6, for what I am going to start antibiotics for coverage of possible diverticulitis. The patient does not have any significant pain at this moment for what I am not going to do a CT scan of the abdomen, just followup on labs.  2. Alcohol abuse. The patient drinks significantly. I will put him on CIWA protocol today. 3. Increased white blood cells. This could be related to diverticulosis, diverticulitis. 4. Increased BUN. This is likely due to gastrointestinal bleeding.  5. Gastroenterology consultation ordered.   CODE STATUS: FULL CODE.   TIME  SPENT: I spent about 35 minutes with this admission. ____________________________ Playita Cortada Sink, MD rsg:sb D: 04/21/2012 13:31:49 ET T: 04/21/2012 13:53:02 ET JOB#: [id number]  cc: Hastings Sink, MD, <Dictator> Shenise Wolgamott America Brown MD ELECTRONICALLY SIGNED 04/28/2012 13:48

## 2014-07-08 NOTE — Consult Note (Signed)
PATIENT NAME:  James Faulkner, James Faulkner MR#:  [id number] DATE OF BIRTH:  11/03/1940  DATE OF CONSULTATION:  04/21/2012  REFERRING PHYSICIAN:  Ste. Genevieve Sink, MD   CONSULTING PHYSICIAN:  Theodore Demark, NP  REASON FOR CONSULTATION: Appreciate consult for this 74 year old Caucasian man with history of peptic ulcer disease in the 1990s, chronic tobacco and alcohol use, admitted with likely GI bleed for the evaluation of the same.  This consult was ordered by Dr. Laurin Coder. The patient states he was in his usual state of health until Sunday night after a pork barbecue dinner, started feeling unwell Monday morning, woke up and passed a black stool, vomited some dark brown material that looked like coffee grounds. He states he has passed several more black stools since then, last was today while he was in the Emergency Department.  He states he feels fatigued and weak, declined admission yesterday but agreed today. He states that he has some discomfort above the umbilicus but denies pain, denies heartburn, reflux, nausea, problems swallowing, and all further GI complaints. He states that he started taking Pepcid yesterday after he noticed the black stool. He took an aspirin three days ago but denies other use of Nonsteroidal anti-inflammatory drugs. Noted 2.5 hemoglobin drop since yesterday, platelets normal, PT-INR normal.  He endorses 2 to 3 alcoholic beverages a day.  He thinks he had colonoscopy a couple of years ago. I do not see this record in [facility]. He had remote EGD in the 1990s with an episode of peptic ulcer.   PAST MEDICAL HISTORY: Peptic ulcer disease in 1990, pelvic fracture, remote history as a young man, this repaired without surgery.   ALLERGIES: SULFA.   PAST SURGICAL HISTORY: Appendectomy, tonsillectomy.   FAMILY HISTORY: Negative for colorectal cancer, colon polyps, liver disease, ulcers.   SOCIAL HISTORY: A 60 pack-year cigarette smoking history. He endorses 2 to 3 alcoholic  beverages every night. No history of DTs. 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 No illicits. He lives with wife. Prior history of packed red blood cell transfusions which he tolerated well and has had no known complications.   CURRENT MEDICATIONS: Vitamin C, Pepcid p.r.n., Tylenol p.r.n.  REVIEW OF SYSTEMS: A 10-point review was done and is unremarkable other than what is written above.   LABORATORY, DIAGNOSTIC AND RADIOLOGICAL DATA:  Most recent: BUN 41, creatinine 1.06, sodium 141, potassium 4.4, GFR greater than 60, calcium 8.8, lipase 170. Liver panel normal. WBC 13.6, hemoglobin 11.9, hematocrit 35.4, platelets 264. Red cells normocytic. PT 13.7, INR 1.0.    Has a normal EKG per chart note.   PHYSICAL EXAMINATION: VITAL SIGNS: Most recent vital signs: Temperature 98.7, pulse 77, respiratory rate 18, blood pressure 132/73, SaO2 97% on room air.  GENERAL: A well-appearing Caucasian man lying in bed in no acute distress.  PSYCHIATRIC: Pleasant, calm, cooperative, responds appropriately, mood stable.  HEENT: Normocephalic, atraumatic. Conjunctivae pink. Oral mucous membranes pink. No scleral icterus. No redness, drainage, or inflammation to the eyes or the nares.  NECK: Supple, no thyromegaly, JVD, adenopathy.  CARDIAC: S1, S2, regular rate and rhythm. No MRG. Peripheral pulses 2+. No appreciable edema.  RESPIRATORY: Respirations eupneic. Lungs are CTAB.  ABDOMEN: Nondistended. Bowel sounds x 4, very soft. No tenderness appreciated. No guarding, rigidity, peritoneal signs, hepatosplenomegaly  or masses noted.  GENITOURINARY: Deferred.  RECTAL: Positive guaiac yesterday. I did not repeat.  EXTREMITIES: MAEW x 4. No clubbing, cyanosis, or edema. Strength 5 out of 5.  NEUROLOGICAL: Alert, oriented x 3. Cranial nerves  II through XII are grossly intact. Speech clear. No facial droop.  SKIN: Warm, dry, pink. No erythema, lesion or rash   IMPRESSION AND PLAN: Hematemesis with melena, drop in hemoglobin  concerning for upper GI bleeding. Agree with serial hemoglobins, pantoprazole drip.  Would transfuse as required. Additionally, we will plan for endoscopy tomorrow afternoon as clinically feasible. I discussed the indications, risks and benefits with the patient, and he is agreeable.      These services were provided by Stephens November, MSN, [facility] in collaboration with Lollie Sails, MD with whom I have discussed this patient in full.   Thank you for this consult.   ____________________________ Theodore Demark, NP chl:cb D: 04/21/2012 17:17:39 ET T: 04/21/2012 17:58:10 ET JOB#: [id number]  cc: Theodore Demark, NP, <Dictator> [place] SIGNED 04/27/2012 6:33

## 2014-07-08 NOTE — Consult Note (Signed)
Chief Complaint:  Subjective/Chief Complaint Sick of eating liquid diet. Wants solids. No bleeding. Hgb stable. No BM. Abd distension.   VITAL SIGNS/ANCILLARY NOTES: **Vital Signs.:   09-Feb-14 06:00  Vital Signs Type Q 4hr  Temperature Temperature (F) 98.4  Celsius 36.8  Temperature Source oral  Pulse Pulse 86  Respirations Respirations 18  Systolic BP Systolic BP 196  Diastolic BP (mmHg) Diastolic BP (mmHg) 66  Mean BP 91  Pulse Ox % Pulse Ox % 95  Pulse Ox Activity Level  At rest  Oxygen Delivery Room Air/ 21 %   Brief Assessment:  Cardiac Regular   Respiratory clear BS   Gastrointestinal distended. active bowel sounds.   Lab Results: Routine Chem:  08-Feb-14 04:40   Glucose, Serum 84  BUN 13  Creatinine (comp) 0.79  Sodium, Serum 144  Potassium, Serum 3.5  Chloride, Serum  113  CO2, Serum 21  Calcium (Total), Serum  7.4  Anion Gap 10  Osmolality (calc) 286  eGFR (African American) >60  eGFR (Non-African American) >60 (eGFR values <38m/min/1.73 m2 may be an indication of chronic kidney disease (CKD). Calculated eGFR is useful in patients with stable renal function. The eGFR calculation will not be reliable in acutely ill patients when serum creatinine is changing rapidly. It is not useful in  patients on dialysis. The eGFR calculation may not be applicable to patients at the low and high extremes of body sizes, pregnant women, and vegetarians.)  Routine Hem:  08-Feb-14 04:40   Hemoglobin (CBC)  9.1  WBC (CBC)  11.2  RBC (CBC)  3.07  Hematocrit (CBC)  26.6  Platelet Count (CBC) 171  MCV 87  MCH 29.7  MCHC 34.2  RDW  15.2  Neutrophil % 72.3  Lymphocyte % 13.0  Monocyte % 9.3  Eosinophil % 4.7  Basophil % 0.7  Neutrophil #  8.1  Lymphocyte # 1.5  Monocyte # 1.0  Eosinophil # 0.5  Basophil # 0.1 (Result(s) reported on 25 Apr 2012 at 05:32AM.)   Assessment/Plan:  Assessment/Plan:  Assessment Large duodenal ulcer. Stable on meds.   Plan Order  abd series today. If ok, then try low residue diet by tonight. Dr. SGustavo Lahwill see patient tomorrow. Thanks.   Electronic Signatures: OVerdie Shire(MD)  (Signed 09-Feb-14 10:08)  Authored: Chief Complaint, VITAL SIGNS/ANCILLARY NOTES, Brief Assessment, Lab Results, Assessment/Plan   Last Updated: 09-Feb-14 10:08 by OVerdie Shire(MD)

## 2014-07-08 NOTE — Consult Note (Signed)
Chief Complaint:   Subjective/Chief Complaint called to see patient due to recurrent hematemesis.  patient is hemodynamically stable m material that is in the kidney basin at bedside is small volume and appears to be expectorated.  Patietn is more nervous and I am concerned that he may be beginning withdrawl from etoh.  Large black stool this am noted.  BUN improved.   Recommend transfer to ICU, start octreotide.   I am trying to get coverage to do egd sooner.  Paging rounding internist.   VITAL SIGNS/ANCILLARY NOTES: **Vital Signs.:   05-Feb-14 08:32   Vital Signs Type Q 4hr   Temperature Temperature (F) 97.7   Celsius 36.5   Temperature Source Oral   Pulse Pulse 83   Respirations Respirations 19   Systolic BP Systolic BP 638   Diastolic BP (mmHg) Diastolic BP (mmHg) 75   Mean BP 94   Pulse Ox % Pulse Ox % 98   Pulse Ox Activity Level  At rest   Oxygen Delivery Room Air/ 21 %    08:35   Vital Signs Type Q 4hr   Pulse Pulse 86   Pulse source if not from Vital Sign Device per Telemetry Clerk   Telemetry pattern Cardiac Rhythm Normal sinus rhythm; pattern reported by Telemetry Clerk   Electronic Signatures: Loistine Simas (MD)  (Signed 05-Feb-14 09:47)  Authored: Chief Complaint, VITAL SIGNS/ANCILLARY NOTES   Last Updated: 05-Feb-14 09:47 by Loistine Simas (MD)

## 2014-07-08 NOTE — Consult Note (Signed)
Chief Complaint:   Subjective/Chief Complaint patient seen for upper gi bleeding.  patietn stable without recurrent melena today.  no n/v or abdominal pain, wants to go home, very little insight. On ciwa.   VITAL SIGNS/ANCILLARY NOTES: **Vital Signs.:   07-Feb-14 16:21   Vital Signs Type Upon Transfer   Pulse Pulse 76   Systolic BP Systolic BP 160   Diastolic BP (mmHg) Diastolic BP (mmHg) 68   Mean BP 93   Pulse Ox % Pulse Ox % 97   Pulse Ox Activity Level  At rest   Oxygen Delivery Room Air/ 21 %   Pulse Ox Heart Rate 78  *Intake and Output.:   07-Feb-14 01:11   Stool  1 large liquid stool   Brief Assessment:   Cardiac Regular    Respiratory clear BS    Gastrointestinal details normal Soft  Nontender  Nondistended  No masses palpable  Bowel sounds normal   Lab Results: Routine Chem:  06-Feb-14 04:21    BUN  30  07-Feb-14 02:42    Glucose, Serum 86   BUN  22   Creatinine (comp) 0.77   Sodium, Serum  148   Potassium, Serum 3.8   Chloride, Serum  120   CO2, Serum 23   Calcium (Total), Serum  7.3   Anion Gap  5   Osmolality (calc) 297   eGFR (African American) >60   eGFR (Non-African American) >60 (eGFR values <63m/min/1.73 m2 may be an indication of chronic kidney disease (CKD). Calculated eGFR is useful in patients with stable renal function. The eGFR calculation will not be reliable in acutely ill patients when serum creatinine is changing rapidly. It is not useful in  patients on dialysis. The eGFR calculation may not be applicable to patients at the low and high extremes of body sizes, pregnant women, and vegetarians.)  Routine Hem:  07-Feb-14 02:42    WBC (CBC)  11.9   RBC (CBC)  2.79   Hemoglobin (CBC)  8.0   Hematocrit (CBC)  24.2   Platelet Count (CBC) 161   MCV 87   MCH 28.9   MCHC 33.2   RDW  16.0   Neutrophil % 73.1   Lymphocyte % 13.0   Monocyte % 7.6   Eosinophil % 5.5   Basophil % 0.8   Neutrophil #  8.7   Lymphocyte # 1.5   Monocyte  # 0.9   Eosinophil # 0.7   Basophil # 0.1 (Result(s) reported on 24 Apr 2012 at 03:38AM.)    07:47    Hemoglobin (CBC)  8.5 (Result(s) reported on 24 Apr 2012 at 08:36AM.)    11:48    WBC (CBC)  13.5   RBC (CBC)  3.27   Hemoglobin (CBC)  9.8   Hematocrit (CBC)  28.4   Platelet Count (CBC) 163   MCV 87   MCH 30.1   MCHC 34.7   RDW  15.5   Neutrophil % 70.1   Lymphocyte % 16.0   Monocyte % 8.3   Eosinophil % 5.0   Basophil % 0.6   Neutrophil #  9.5   Lymphocyte # 2.2   Monocyte #  1.1   Eosinophil # 0.7   Basophil # 0.1 (Result(s) reported on 24 Apr 2012 at 12:07PM.)   Assessment/Plan:  Assessment/Plan:   Assessment 1) upper gi bleed due to large DU-currently stable.   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  Appreciate surgery assistance.  DR Oh rounding over the weekend.   Electronic Signatures: Loistine Simas (MD)  (Signed 07-Feb-14 16:35)  Authored: Chief Complaint, VITAL SIGNS/ANCILLARY NOTES, Brief Assessment, Lab Results, Assessment/Plan   Last Updated: 07-Feb-14 16:35 by Loistine Simas (MD)

## 2014-07-08 NOTE — Consult Note (Signed)
Chief Complaint:   Subjective/Chief Complaint seen for melena. large  melena stool shortly ago.  blood transfusing.  denies abdominal apin, note episode this of possible small amount of hematemesis versus hemoptysis.   VITAL SIGNS/ANCILLARY NOTES: **Vital Signs.:   05-Feb-14 12:51   Vital Signs Type Pre-Blood   Temperature Temperature (F) 98.1   Celsius 36.7   Temperature Source oral   Pulse Pulse 87   Respirations Respirations 21   Systolic BP Systolic BP 502   Diastolic BP (mmHg) Diastolic BP (mmHg) 52   Mean BP 68   Pulse Ox % Pulse Ox % 95   Oxygen Delivery Room Air/ 21 %  *Intake and Output.:   05-Feb-14 10:00   Stool  large dark liquid stool   Brief Assessment:   Cardiac Regular    Respiratory clear BS    Gastrointestinal details normal Soft  Nontender  Nondistended  No masses palpable  Bowel sounds normal   Lab Results: Hepatic:  05-Feb-14 01:30    Bilirubin, Total 0.4   Alkaline Phosphatase 56   SGPT (ALT) 19   SGOT (AST) 20   Total Protein, Serum  5.3   Albumin, Serum  2.8  Routine Chem:  05-Feb-14 01:30    Glucose, Serum 85   BUN  30   Creatinine (comp) 0.87   Sodium, Serum 144   Potassium, Serum 4.2   Chloride, Serum  114   CO2, Serum 25   Calcium (Total), Serum  8.0   Osmolality (calc) 292   eGFR (African American) >60   eGFR (Non-African American) >60 (eGFR values <59m/min/1.73 m2 may be an indication of chronic kidney disease (CKD). Calculated eGFR is useful in patients with stable renal function. The eGFR calculation will not be reliable in acutely ill patients when serum creatinine is changing rapidly. It is not useful in  patients on dialysis. The eGFR calculation may not be applicable to patients at the low and high extremes of body sizes, pregnant women, and vegetarians.)   Anion Gap  5   Magnesium, Serum 2.1 (1.8-2.4 THERAPEUTIC RANGE: 4-7 mg/dL TOXIC: > 10 mg/dL  -----------------------)  Routine Hem:  05-Feb-14 01:30     Hemoglobin (CBC)  9.3 (Result(s) reported on 22 Apr 2012 at 02:07AM.)    09:55    Hemoglobin (CBC)  7.5 (Result(s) reported on 22 Apr 2012 at 10:12AM.)   Assessment/Plan:  Assessment/Plan:   Assessment 1) melena-recurrent, in the setting of h/o nsaids, prior h/o pudz, daily etoh use.    Plan 1) egd today, I have discussed the risks benefits and complications of egd to include not limited to bleeding infection perofration and sedattion and he wishes to proceed.   Electronic Signatures: SLoistine Simas(MD)  (Signed 05-Feb-14 13:30)  Authored: Chief Complaint, VITAL SIGNS/ANCILLARY NOTES, Brief Assessment, Lab Results, Assessment/Plan   Last Updated: 05-Feb-14 13:30 by SLoistine Simas(MD)

## 2014-07-08 NOTE — Discharge Summary (Signed)
PATIENT NAME:  James Faulkner, James Faulkner MR#:  [id number] DATE OF BIRTH:  1940-05-16  DATE OF ADMISSION:  04/21/2012 DATE OF DISCHARGE:  04/27/2012  DISCHARGE DIAGNOSES:  Duodenal ulcers with gastrointestinal bleed, alcohol abuse, acute blood loss anemia.   PRIMARY CARE PHYSICIAN:  Nonlocal.  PRIMARY GASTROENTEROLOGIST:  Dr. Loistine Simas.   HISTORY OF PRESENT ILLNESS:  The patient is a 74 year old man with history of peptic ulcer disease, was in ER the day before presentation after an episode of coffee-ground vomiting 2 times and large melanotic bowel movement for 3 times on the morning of the presentation, was actually admitted.  The patient was advised to stay for observation in the hospital and followup of his hemoglobin and endoscopy, but he was not willing to stay.  He decided to go home [place] and he came back again because of worsening of the problem. So on February 3rd, which was the next day of the Superbowl after he had party of Superbowl while eating grapes, peanuts and alcohol with friends and relatives, next day on February 3rd he woke up at 6:00 a.m., felt very dizzy, lightheaded, very weak and had a large bowel movement with dark blood mixed with some red blood, coffee-ground vomiting 2 times in the morning and came to the Emergency Room.  He was a chronic smoker for several years and hemoglobin was around 14.  He already had GI bleeding episode in the past in 90s where he required several transfusion of the blood and was very sick and hemorrhagic shock, so on February 3rd after going home he again had large bowel movement with blood and dark stools and he felt much weaker and lightheaded and more dizzy, so decided to come to the Emergency Room.  Hemoglobin dropped from 14.1 to 11.9.  His blood pressure was 158/75 and heart rate was 97.   HOSPITAL COURSE AND STAY:  For his GI bleed, GI consult was done.  He was admitted with nothing by mouth, IV PPI drip.  Endoscopy was done by Dr.  Gustavo Lah which shows exam of the esophagus were normal, old blood, a small amount of coffee ground noted in the esophagus.  The entire examination of stomach was normal.  Coffee-ground material noted throughout the gastric vault.  No evidence of bleeding lesion.  One partially obstructing nonbleeding duodenal ulcer with visible vessel was found in the second to third part of the duodenum.  This lesion was ulceration, size a millimeter in largest diameter and also could be visualized with difficulty due to local edema.  No active bleeding.  So initially he was on octreotide drip, but then discontinued and he was just continued on pantoprazole drip.  Surgery consult was also called in for this and surgeons note that initially he was too unstable for OR, but if he becomes stable he might need further surgical workup, but later on patient's hemoglobin remained stable after transfusion and he started tolerating diet slowly, so surgeon signed off the case without any surgical workup done in this hospital course.  He totally required 5 PRBC transfusion during this hospital stay.   OTHER MEDICAL ISSUES ADDRESSED DURING THE HOSPITAL:   1.  Anemia of acute blood loss.  His hemoglobin which was 14 before the episode which came down to 7.5 on February 5th which again dropped, after transfusion it went up and then dropped again.  Totally he received 5 units of PRBC transfusion and then his hemoglobin remained stable around the range of 9.  2.  Leukocytosis, possibly this was reactive due to acute blood loss episode, but he was also started on ciprofloxacin for possibility of gastroenteritis of anything and then after a few days it stopped.  3.  Dehydration due to GI bleed and not able to eat.  He was continued on normal saline IV fluids and he improved.  4.  Hypernatremia.  He was started on D5 half normal saline and that resolved.  5.  Alcohol abuse.  He was monitored on CIWA protocol while he was in the hospital and he  did not need much medication for that.   CONSULTATIONS DURING THE HOSPITAL:  Dr. Sherri Rad for surgical consult.  Dr. Loistine Simas for GI consult.   [place]:  Glucose 111, BUN 35, creatinine 1.06, sodium 141, potassium 4.7, chloride 109, CO2 30, WBC 13.8, hemoglobin 14.1, MCV 93, lipase 165, INR 1.0, hemoglobin dropped to 7.5 and on discharge hemoglobin stable, more than 9 for 48 hours.   CONDITION ON DISCHARGE:  Stable.   CODE STATUS:  FULL CODE.  MEDICATIONS ADVISED ON DISCHARGE:  Ferrous sulfate 325 mg oral tablet 3 times a day, pantoprazole 40 mg oral delayed-release tablet 2 times a day.   HOME OXYGEN ON DISCHARGE:  No.   DIET ON DISCHARGE:  Regular with diet consistency mechanical soft.   SPECIAL INSTRUCTIONS:  Eat low residue diet, mild, less spicy, less oily, less fibrous, absolutely no alcohol.  No pain medication.  No aspirin.     ACTIVITY LIMITATION:  As tolerated.   TIMEFRAME TO FOLLOW UP:  1 to 2 weeks with Dr. Gustavo Lah.    TOTAL TIME SPENT IN THIS DISCHARGE:  45 minutes.     ____________________________ Ceasar Lund Anselm Jungling, MD vgv:ea D: 04/30/2012 22:48:29 ET T: 05/01/2012 00:58:42 ET JOB#: [id number]  cc: Ceasar Lund. Anselm Jungling, MD, <Dictator> Lollie Sails, MD   Vaughan Basta MD ELECTRONICALLY SIGNED 05/08/2012 18:06

## 2014-07-08 NOTE — Consult Note (Signed)
PATIENT NAME:  James Faulkner, James Faulkner MR#:  [id number] DATE OF BIRTH:  1940-11-02  DATE OF CONSULTATION:  04/23/2012  REFERRING PHYSICIAN:   CONSULTING PHYSICIAN:  Amoria Mclees A. Marina Gravel, MD  REASON FOR CONSULTATION:  Upper GI bleed, duodenal ulcer.   HISTORY:  This is a 74 year old white male admitted to the Intensive Care Unit on February 4th with upper GI bleed. Admission hemoglobin was 11.2. He had been seen the previous day in the Emergency Room with hemoglobin of 14.1. Over the course of the February 4th, the patient had a drop of his hemoglobin from 11.9 to 10.2. On the morning of February 5th, it was 7.5. He received 2 units of blood and it was 8.8, this was at 2100 last night. This morning at 4:30 despite being hemodynamically stable, the patient had hemoglobin of 7.5 and platelet count of 125,000. He has now completed unit #4. A hemoglobin is pending. The patient did have a large maroon stool earlier today. The patient has remained hemodynamically stable. An upper endoscopy was performed by Dr. Gustavo Lah yesterday which demonstrated a large ulcer seen in the duodenum with visible vessel. The ulcer appeared to be partially obstructing. No active bleeding was seen. The patient does have a strong history of chronic alcoholism.  In speaking with his wife she says he drinks very little.   He does admit to some occasional NSAID use. He does relate a previous history of significant upper GI hemorrhage in the 1990s requiring multiple blood transfusions. He has had no surgical intervention for duodenal ulcer.   ALLERGIES:  SULFA.   MEDICATIONS:  At present include Protonix drip, IV fluids, blood transfusion which just finished, Cipro, Ativan, Flagyl, morphine, Zofran, Phenergan, Ambien, Ativan per CIWA protocol.   PAST MEDICAL HISTORY:  As described above.   PAST SURGICAL HISTORY:  Of the abdomen is negative.   PHYSICAL EXAMINATION: GENERAL: The patient is alert and oriented.  VITAL SIGNS: Temperature 98.1, pulse  81, blood pressure 125/58, room air saturation 98%.  LUNGS: Clear.  HEART: Regular rate and rhythm.  ABDOMEN: Soft and nontender. No scars. No hernias.  EXTREMITIES: Warm and well perfused   DIAGNOSTIC DATA:  Laboratory values as described above. Endoscopy as described above.  No good pictures are available of the ulcer.   IMPRESSION:  Upper gastrointestinal hemorrhage for which he is now on his 4 th unit of blood. Post transfusion hemoglobin is pending. The patient has remained hemodynamically stable. I suspect if he is continuing to be hemodynamically stable, a repeat endoscopy if he continues to bleed would be of benefit. If he becomes hemodynamically unstable then laparotomy with duodenostomy and oversew of the ulcer with or without vagotomy would need to be entertained. I discussed this with the patient as well as Dr. Gustavo Lah by telephone.    ____________________________ Jeannette How. Marina Gravel, MD mab:si D: 04/23/2012 18:22:10 ET T: 04/23/2012 20:20:52 ET JOB#: [id number]  cc: Elta Guadeloupe A. Marina Gravel, MD, <Dictator> Hortencia Conradi MD ELECTRONICALLY SIGNED 04/24/2012 9:17

## 2014-07-12 DIAGNOSIS — W57XXXA Bitten or stung by nonvenomous insect and other nonvenomous arthropods, initial encounter: Secondary | ICD-10-CM | POA: Diagnosis not present

## 2014-07-12 DIAGNOSIS — M25521 Pain in right elbow: Secondary | ICD-10-CM | POA: Diagnosis not present

## 2014-07-14 DIAGNOSIS — H409 Unspecified glaucoma: Secondary | ICD-10-CM | POA: Diagnosis not present

## 2014-07-14 DIAGNOSIS — W57XXXA Bitten or stung by nonvenomous insect and other nonvenomous arthropods, initial encounter: Secondary | ICD-10-CM | POA: Diagnosis not present

## 2014-07-14 DIAGNOSIS — L03119 Cellulitis of unspecified part of limb: Secondary | ICD-10-CM | POA: Diagnosis not present

## 2014-08-12 DIAGNOSIS — Z720 Tobacco use: Secondary | ICD-10-CM | POA: Diagnosis not present

## 2014-08-12 DIAGNOSIS — I119 Hypertensive heart disease without heart failure: Secondary | ICD-10-CM | POA: Diagnosis not present

## 2014-08-12 DIAGNOSIS — Z789 Other specified health status: Secondary | ICD-10-CM | POA: Diagnosis not present

## 2014-08-12 DIAGNOSIS — J410 Simple chronic bronchitis: Secondary | ICD-10-CM | POA: Diagnosis not present

## 2014-08-16 DIAGNOSIS — I119 Hypertensive heart disease without heart failure: Secondary | ICD-10-CM | POA: Diagnosis not present

## 2014-08-17 DIAGNOSIS — I10 Essential (primary) hypertension: Secondary | ICD-10-CM | POA: Diagnosis not present

## 2014-08-17 DIAGNOSIS — E119 Type 2 diabetes mellitus without complications: Secondary | ICD-10-CM | POA: Diagnosis not present

## 2014-08-18 DIAGNOSIS — H4011X0 Primary open-angle glaucoma, stage unspecified: Secondary | ICD-10-CM | POA: Diagnosis not present

## 2014-08-19 DIAGNOSIS — I119 Hypertensive heart disease without heart failure: Secondary | ICD-10-CM | POA: Diagnosis not present

## 2014-08-19 DIAGNOSIS — J3489 Other specified disorders of nose and nasal sinuses: Secondary | ICD-10-CM | POA: Diagnosis not present

## 2014-08-19 DIAGNOSIS — H409 Unspecified glaucoma: Secondary | ICD-10-CM | POA: Diagnosis not present

## 2014-08-25 DIAGNOSIS — H2511 Age-related nuclear cataract, right eye: Secondary | ICD-10-CM | POA: Diagnosis not present

## 2014-10-19 DIAGNOSIS — H5711 Ocular pain, right eye: Secondary | ICD-10-CM | POA: Diagnosis not present

## 2014-10-19 DIAGNOSIS — I119 Hypertensive heart disease without heart failure: Secondary | ICD-10-CM | POA: Diagnosis not present

## 2014-10-26 DIAGNOSIS — Z720 Tobacco use: Secondary | ICD-10-CM | POA: Diagnosis not present

## 2014-10-26 DIAGNOSIS — I119 Hypertensive heart disease without heart failure: Secondary | ICD-10-CM | POA: Diagnosis not present

## 2014-10-26 DIAGNOSIS — J410 Simple chronic bronchitis: Secondary | ICD-10-CM | POA: Diagnosis not present

## 2014-10-26 DIAGNOSIS — H409 Unspecified glaucoma: Secondary | ICD-10-CM | POA: Diagnosis not present

## 2015-01-05 DIAGNOSIS — I119 Hypertensive heart disease without heart failure: Secondary | ICD-10-CM | POA: Diagnosis not present

## 2015-01-05 DIAGNOSIS — R05 Cough: Secondary | ICD-10-CM | POA: Diagnosis not present

## 2015-01-05 DIAGNOSIS — M5440 Lumbago with sciatica, unspecified side: Secondary | ICD-10-CM | POA: Diagnosis not present

## 2015-01-05 DIAGNOSIS — R42 Dizziness and giddiness: Secondary | ICD-10-CM | POA: Diagnosis not present

## 2015-01-05 DIAGNOSIS — M539 Dorsopathy, unspecified: Secondary | ICD-10-CM | POA: Diagnosis not present

## 2015-01-05 DIAGNOSIS — J0301 Acute recurrent streptococcal tonsillitis: Secondary | ICD-10-CM | POA: Diagnosis not present

## 2015-01-05 DIAGNOSIS — J309 Allergic rhinitis, unspecified: Secondary | ICD-10-CM | POA: Diagnosis not present

## 2015-01-09 ENCOUNTER — Other Ambulatory Visit: Payer: Self-pay | Admitting: Internal Medicine

## 2015-01-09 ENCOUNTER — Ambulatory Visit
Admission: RE | Admit: 2015-01-09 | Discharge: 2015-01-09 | Disposition: A | Discharge status: Home / Self Care | Payer: Medicare Other | Source: Ambulatory Visit | Attending: Internal Medicine | Admitting: Internal Medicine

## 2015-01-09 ENCOUNTER — Ambulatory Visit
Admission: RE | Admit: 2015-01-09 | Discharge: 2015-01-09 | Disposition: A | Discharge status: Home / Self Care | Payer: Medicare Other | Source: Ambulatory Visit | Attending: Cardiology | Admitting: Cardiology

## 2015-01-09 DIAGNOSIS — J449 Chronic obstructive pulmonary disease, unspecified: Secondary | ICD-10-CM | POA: Insufficient documentation

## 2015-01-09 DIAGNOSIS — R079 Chest pain, unspecified: Secondary | ICD-10-CM | POA: Diagnosis not present

## 2015-01-26 DIAGNOSIS — Z23 Encounter for immunization: Secondary | ICD-10-CM | POA: Diagnosis not present

## 2015-01-26 DIAGNOSIS — I119 Hypertensive heart disease without heart failure: Secondary | ICD-10-CM | POA: Diagnosis not present

## 2015-02-22 DIAGNOSIS — H2511 Age-related nuclear cataract, right eye: Secondary | ICD-10-CM | POA: Diagnosis not present

## 2015-02-23 DIAGNOSIS — Z2821 Immunization not carried out because of patient refusal: Secondary | ICD-10-CM | POA: Diagnosis not present

## 2015-02-23 DIAGNOSIS — Z0000 Encounter for general adult medical examination without abnormal findings: Secondary | ICD-10-CM | POA: Diagnosis not present

## 2015-02-23 DIAGNOSIS — Z720 Tobacco use: Secondary | ICD-10-CM | POA: Diagnosis not present

## 2015-02-24 DIAGNOSIS — H2511 Age-related nuclear cataract, right eye: Secondary | ICD-10-CM | POA: Diagnosis not present

## 2015-02-27 ENCOUNTER — Encounter: Payer: Self-pay | Admitting: *Deleted

## 2015-03-06 ENCOUNTER — Ambulatory Visit: Payer: Medicare Other | Admitting: Anesthesiology

## 2015-03-06 ENCOUNTER — Encounter: Payer: Self-pay | Admitting: *Deleted

## 2015-03-06 ENCOUNTER — Ambulatory Visit: Admission: RE | Admit: 2015-03-06 | Payer: Medicare Other | Source: Ambulatory Visit | Admitting: Ophthalmology

## 2015-03-06 ENCOUNTER — Ambulatory Visit
Admission: RE | Admit: 2015-03-06 | Discharge: 2015-03-06 | Disposition: A | Discharge status: Home / Self Care | Payer: Medicare Other | Source: Ambulatory Visit | Attending: Ophthalmology | Admitting: Ophthalmology

## 2015-03-06 ENCOUNTER — Encounter
Admission: RE | Disposition: A | Discharge status: Home / Self Care | Payer: Self-pay | Source: Ambulatory Visit | Attending: Ophthalmology

## 2015-03-06 ENCOUNTER — Encounter: Admission: RE | Payer: Self-pay | Source: Ambulatory Visit

## 2015-03-06 DIAGNOSIS — K219 Gastro-esophageal reflux disease without esophagitis: Secondary | ICD-10-CM | POA: Diagnosis not present

## 2015-03-06 DIAGNOSIS — H2511 Age-related nuclear cataract, right eye: Secondary | ICD-10-CM | POA: Diagnosis not present

## 2015-03-06 DIAGNOSIS — J449 Chronic obstructive pulmonary disease, unspecified: Secondary | ICD-10-CM | POA: Diagnosis not present

## 2015-03-06 DIAGNOSIS — M1990 Unspecified osteoarthritis, unspecified site: Secondary | ICD-10-CM | POA: Diagnosis not present

## 2015-03-06 DIAGNOSIS — Z9842 Cataract extraction status, left eye: Secondary | ICD-10-CM | POA: Diagnosis not present

## 2015-03-06 DIAGNOSIS — R05 Cough: Secondary | ICD-10-CM | POA: Insufficient documentation

## 2015-03-06 DIAGNOSIS — I10 Essential (primary) hypertension: Secondary | ICD-10-CM | POA: Diagnosis not present

## 2015-03-06 DIAGNOSIS — Z8711 Personal history of peptic ulcer disease: Secondary | ICD-10-CM | POA: Insufficient documentation

## 2015-03-06 DIAGNOSIS — F17200 Nicotine dependence, unspecified, uncomplicated: Secondary | ICD-10-CM | POA: Diagnosis not present

## 2015-03-06 DIAGNOSIS — Z882 Allergy status to sulfonamides status: Secondary | ICD-10-CM | POA: Insufficient documentation

## 2015-03-06 HISTORY — DX: Gastro-esophageal reflux disease without esophagitis: K21.9

## 2015-03-06 HISTORY — DX: Benign prostatic hyperplasia without lower urinary tract symptoms: N40.0

## 2015-03-06 HISTORY — DX: Chronic obstructive pulmonary disease, unspecified: J44.9

## 2015-03-06 HISTORY — DX: Cough, unspecified: R05.9

## 2015-03-06 HISTORY — DX: Cough: R05

## 2015-03-06 HISTORY — PX: CATARACT EXTRACTION W/PHACO: SHX586

## 2015-03-06 HISTORY — DX: Unspecified osteoarthritis, unspecified site: M19.90

## 2015-03-06 HISTORY — DX: Essential (primary) hypertension: I10

## 2015-03-06 SURGERY — PHACOEMULSIFICATION, CATARACT, WITH IOL INSERTION
Anesthesia: Monitor Anesthesia Care | Site: Eye | Laterality: Right | Wound class: Clean

## 2015-03-06 SURGERY — PHACOEMULSIFICATION, CATARACT, WITH IOL INSERTION
Anesthesia: Choice | Laterality: Right

## 2015-03-06 MED ORDER — CEFUROXIME OPHTHALMIC INJECTION 1 MG/0.1 ML
INJECTION | OPHTHALMIC | Status: AC
  Filled 2015-03-06: qty 0.1

## 2015-03-06 MED ORDER — SODIUM CHLORIDE 0.9 % IV SOLN
INTRAVENOUS | Status: DC
  Administered 2015-03-06: 07:00:00 via INTRAVENOUS

## 2015-03-06 MED ORDER — TETRACAINE HCL 0.5 % OP SOLN
OPHTHALMIC | Status: DC | PRN
  Administered 2015-03-06: 1 [drp] via OPHTHALMIC

## 2015-03-06 MED ORDER — EPINEPHRINE HCL 1 MG/ML IJ SOLN
INTRAMUSCULAR | Status: AC
  Filled 2015-03-06: qty 1

## 2015-03-06 MED ORDER — LIDOCAINE HCL (PF) 4 % IJ SOLN
INTRAOCULAR | Status: DC | PRN
  Administered 2015-03-06: .5 mL via OPHTHALMIC

## 2015-03-06 MED ORDER — TETRACAINE HCL 0.5 % OP SOLN
OPHTHALMIC | Status: AC
  Filled 2015-03-06: qty 2

## 2015-03-06 MED ORDER — LIDOCAINE HCL (PF) 4 % IJ SOLN
INTRAMUSCULAR | Status: AC
  Filled 2015-03-06: qty 10

## 2015-03-06 MED ORDER — MOXIFLOXACIN HCL 0.5 % OP SOLN
1.0000 [drp] | OPHTHALMIC | Status: AC | PRN
  Administered 2015-03-06 (×3): 1 [drp] via OPHTHALMIC

## 2015-03-06 MED ORDER — NA CHONDROIT SULF-NA HYALURON 40-17 MG/ML IO SOLN
INTRAOCULAR | Status: AC
  Filled 2015-03-06: qty 1

## 2015-03-06 MED ORDER — BUPIVACAINE HCL (PF) 0.75 % IJ SOLN
INTRAMUSCULAR | Status: AC
  Filled 2015-03-06: qty 10

## 2015-03-06 MED ORDER — ALFENTANIL 500 MCG/ML IJ INJ
INJECTION | INTRAMUSCULAR | Status: DC | PRN
  Administered 2015-03-06: 500 ug via INTRAVENOUS

## 2015-03-06 MED ORDER — NA CHONDROIT SULF-NA HYALURON 40-17 MG/ML IO SOLN
INTRAOCULAR | Status: DC | PRN
  Administered 2015-03-06: 1 mL via INTRAOCULAR

## 2015-03-06 MED ORDER — CEFUROXIME OPHTHALMIC INJECTION 1 MG/0.1 ML
INJECTION | OPHTHALMIC | Status: DC | PRN
  Administered 2015-03-06: .1 mL via INTRACAMERAL

## 2015-03-06 MED ORDER — ATROPINE SULFATE 1 % OP SOLN
OPHTHALMIC | Status: AC
  Filled 2015-03-06: qty 5

## 2015-03-06 MED ORDER — CYCLOPENTOLATE HCL 2 % OP SOLN
OPHTHALMIC | Status: AC
  Administered 2015-03-06: 1 [drp] via OPHTHALMIC
  Filled 2015-03-06: qty 2

## 2015-03-06 MED ORDER — PHENYLEPHRINE HCL 10 % OP SOLN
OPHTHALMIC | Status: AC
  Administered 2015-03-06: 1 [drp] via OPHTHALMIC
  Filled 2015-03-06: qty 5

## 2015-03-06 MED ORDER — BUPIVACAINE HCL (PF) 0.75 % IJ SOLN
INTRAMUSCULAR | Status: DC | PRN
  Administered 2015-03-06: 4 mL via OPHTHALMIC

## 2015-03-06 MED ORDER — MOXIFLOXACIN HCL 0.5 % OP SOLN
OPHTHALMIC | Status: AC
  Administered 2015-03-06: 1 [drp] via OPHTHALMIC
  Filled 2015-03-06: qty 3

## 2015-03-06 MED ORDER — HYALURONIDASE HUMAN 150 UNIT/ML IJ SOLN
INTRAMUSCULAR | Status: AC
  Filled 2015-03-06: qty 1

## 2015-03-06 MED ORDER — CARBACHOL 0.01 % IO SOLN
INTRAOCULAR | Status: DC | PRN
  Administered 2015-03-06: .5 mL via INTRAOCULAR

## 2015-03-06 MED ORDER — PHENYLEPHRINE HCL 10 % OP SOLN
1.0000 [drp] | OPHTHALMIC | Status: AC | PRN
  Administered 2015-03-06 (×4): 1 [drp] via OPHTHALMIC

## 2015-03-06 MED ORDER — MOXIFLOXACIN HCL 0.5 % OP SOLN
OPHTHALMIC | Status: DC | PRN
  Administered 2015-03-06: 1 [drp] via OPHTHALMIC

## 2015-03-06 MED ORDER — EPINEPHRINE HCL 1 MG/ML IJ SOLN
INTRAOCULAR | Status: DC | PRN
  Administered 2015-03-06: 1 mL via OPHTHALMIC

## 2015-03-06 MED ORDER — MIDAZOLAM HCL 2 MG/2ML IJ SOLN
INTRAMUSCULAR | Status: DC | PRN
  Administered 2015-03-06: 1 mg via INTRAVENOUS

## 2015-03-06 MED ORDER — CYCLOPENTOLATE HCL 2 % OP SOLN
1.0000 [drp] | OPHTHALMIC | Status: AC | PRN
  Administered 2015-03-06 (×4): 1 [drp] via OPHTHALMIC

## 2015-03-06 SURGICAL SUPPLY — 30 items
CANNULA ANT/CHMB 27GA (MISCELLANEOUS) ×3 IMPLANT
CORD BIP STRL DISP 12FT (MISCELLANEOUS) ×3 IMPLANT
CUP MEDICINE 2OZ PLAST GRAD ST (MISCELLANEOUS) ×3 IMPLANT
DRAPE XRAY CASSETTE 23X24 (DRAPES) ×3 IMPLANT
ERASER HMR WETFIELD 18G (MISCELLANEOUS) ×3 IMPLANT
GLOVE BIO SURGEON STRL SZ8 (GLOVE) ×3 IMPLANT
GLOVE SURG LX 6.5 MICRO (GLOVE) ×2
GLOVE SURG LX 8.0 MICRO (GLOVE) ×2
GLOVE SURG LX STRL 6.5 MICRO (GLOVE) ×1 IMPLANT
GLOVE SURG LX STRL 8.0 MICRO (GLOVE) ×1 IMPLANT
GOWN STRL REUS W/ TWL LRG LVL3 (GOWN DISPOSABLE) ×1 IMPLANT
GOWN STRL REUS W/ TWL XL LVL3 (GOWN DISPOSABLE) ×1 IMPLANT
GOWN STRL REUS W/TWL LRG LVL3 (GOWN DISPOSABLE) ×2
GOWN STRL REUS W/TWL XL LVL3 (GOWN DISPOSABLE) ×2
LENS IOL ACRSF IQ ULTRA 24.0 (Intraocular Lens) ×1 IMPLANT
LENS IOL ACRYSOF IQ 24.0 (Intraocular Lens) ×3 IMPLANT
PACK CATARACT (MISCELLANEOUS) ×3 IMPLANT
PACK CATARACT DINGLEDEIN LX (MISCELLANEOUS) ×3 IMPLANT
PACK EYE AFTER SURG (MISCELLANEOUS) ×3 IMPLANT
SHLD EYE VISITEC  UNIV (MISCELLANEOUS) ×3 IMPLANT
SOL BSS BAG (MISCELLANEOUS) ×3
SOL PREP PVP 2OZ (MISCELLANEOUS) ×3
SOLUTION BSS BAG (MISCELLANEOUS) ×1 IMPLANT
SOLUTION PREP PVP 2OZ (MISCELLANEOUS) ×1 IMPLANT
SUT SILK 5-0 (SUTURE) ×3 IMPLANT
SYR 3ML LL SCALE MARK (SYRINGE) ×3 IMPLANT
SYR 5ML LL (SYRINGE) ×3 IMPLANT
SYR TB 1ML 27GX1/2 LL (SYRINGE) ×3 IMPLANT
WATER STERILE IRR 1000ML POUR (IV SOLUTION) ×3 IMPLANT
WIPE NON LINTING 3.25X3.25 (MISCELLANEOUS) ×3 IMPLANT

## 2015-03-06 NOTE — Anesthesia Postprocedure Evaluation (Signed)
Anesthesia Post Note  Patient: James Faulkner  Procedure(s) Performed: Procedure(s) (LRB): CATARACT EXTRACTION PHACO AND INTRAOCULAR LENS PLACEMENT ([facility]) (Right)  Patient location during evaluation: Short Stay Anesthesia Type: MAC Level of consciousness: awake and alert Pain management: pain level controlled Vital Signs Assessment: post-procedure vital signs reviewed and stable Respiratory status: spontaneous breathing Cardiovascular status: blood pressure returned to baseline Anesthetic complications: no    Last Vitals:  Filed Vitals:   03/06/15 0855 03/06/15 0857  BP: 158/68 158/68  Pulse: 60 60  Temp: 36.6 C 36.6 C  Resp: 16 16    Last Pain:  Filed Vitals:   03/06/15 0857  PainSc: 1                  Brantley Fling

## 2015-03-06 NOTE — Interval H&P Note (Signed)
History and Physical Interval Note:  03/06/2015 7:22 AM  James Faulkner  has presented today for surgery, with the diagnosis of cataract  The various methods of treatment have been discussed with the patient and family. After consideration of risks, benefits and other options for treatment, the patient has consented to  Procedure(s): CATARACT EXTRACTION PHACO AND INTRAOCULAR LENS PLACEMENT ([place]) (Right) as a surgical intervention .  The patient's history has been reviewed, patient examined, no change in status, stable for surgery.  I have reviewed the patient's chart and labs.  Questions were answered to the patient's satisfaction.     James Faulkner

## 2015-03-06 NOTE — Anesthesia Preprocedure Evaluation (Signed)
Anesthesia Evaluation  Patient identified by MRN, date of birth, ID band Patient awake    Reviewed: Allergy & Precautions, NPO status , Patient's Chart, lab work & pertinent test results, reviewed documented beta blocker date and time   Airway Mallampati: II  TM Distance: >3 FB     Dental  (+) Chipped   Pulmonary COPD, Current Smoker,           Cardiovascular hypertension, Pt. on medications      Neuro/Psych    GI/Hepatic GERD  ,  Endo/Other    Renal/GU      Musculoskeletal  (+) Arthritis ,   Abdominal   Peds  Hematology   Anesthesia Other Findings   Reproductive/Obstetrics                             Anesthesia Physical Anesthesia Plan  ASA: III  Anesthesia Plan: MAC   Post-op Pain Management:    Induction: Intravenous  Airway Management Planned:   Additional Equipment:   Intra-op Plan:   Post-operative Plan:   Informed Consent: I have reviewed the patients History and Physical, chart, labs and discussed the procedure including the risks, benefits and alternatives for the proposed anesthesia with the patient or authorized representative who has indicated his/her understanding and acceptance.     Plan Discussed with: CRNA  Anesthesia Plan Comments:         Anesthesia Quick Evaluation

## 2015-03-06 NOTE — Discharge Instructions (Signed)
Eye Surgery Discharge Instructions  Expect mild scratchy sensation or mild soreness. DO NOT RUB YOUR EYE!  The day of surgery:  Minimal physical activity, but bed rest is not required  No reading, computer work, or close hand work  No bending, lifting, or straining.  May watch TV  For 24 hours:  No driving, legal decisions, or alcoholic beverages  Safety precautions  Eat anything you prefer: It is better to start with liquids, then soup then solid foods.  _____ Eye patch should be worn until postoperative exam tomorrow.  ____ Solar shield eyeglasses should be worn for comfort in the sunlight/patch while sleeping  Resume all regular medications including aspirin or Coumadin if these were discontinued prior to surgery. You may shower, bathe, shave, or wash your hair. Tylenol may be taken for mild discomfort.  Call your doctor if you experience significant pain, nausea, or vomiting, fever > 101 or other signs of infection. [phone] or [phone] Specific instructions:  Follow-up Information    Follow up with Davinder Haff, MD In 1 day.   Specialty:  Ophthalmology   Why:  For wound re-check @ 10:00   Contact information:   [address]   [place] [address] [phone]

## 2015-03-06 NOTE — Op Note (Signed)
Date of Surgery: 03/06/2015 Date of Dictation: 03/06/2015 8:52 AM Pre-operative Diagnosis:  Nuclear Sclerotic Cataract right Eye Post-operative Diagnosis: same Procedure performed: Extra-capsular Cataract Extraction (ECCE) with placement of a posterior chamber intraocular lens (IOL) right Eye IOL:  Implant Name Type Inv. Item Serial No. Manufacturer Lot No. LRB No. Used  LENS IOL ACRYSOF IQ 24.0 - [id number] Intraocular Lens LENS IOL ACRYSOF IQ 24.0 [id number] ALCON   Right 1   Anesthesia: 2% Lidocaine and 4% Marcaine in a 50/50 mixture with 10 unites/ml of Hylenex given as a peribulbar Anesthesiologist: Anesthesiologist: Gunnar Bulla, MD CRNA: Rolla Plate, CRNA Complications: none Estimated Blood Loss: less than 1 ml  Description of procedure:  The patient was given anesthesia and sedation via intravenous access. The patient was then prepped and draped in the usual fashion. A 25-gauge needle was bent for initiating the capsulorhexis. A 5-0 silk suture was placed through the conjunctiva superior and inferiorly to serve as bridle sutures. Hemostasis was obtained at the superior limbus using an eraser cautery. A partial thickness groove was made at the anterior surgical limbus with a 64 Beaver blade and this was dissected anteriorly with an [employer]. The anterior chamber was entered at 10 o'clock with a 1.0 mm paracentesis knife and through the lamellar dissection with a 2.6 mm Alcon keratome. Epi-Shugarcaine 0.5 CC [9 cc BSS Plus (Alcon), 3 cc 4% preservative-free lidocaine (Hospira) and 4 cc 1:1000 preservative-free, bisulfite-free epinephrine] was injected into the anterior chamber via the paracentesis tract. Epi-Shugarcaine 0.5 CC [9 cc BSS Plus (Alcon), 3 cc 4% preservative-free lidocaine (Hospira) and 4 cc 1:1000 preservative-free, bisulfite-free epinephrine] was injected into the anterior chamber via the paracentesis tract. DiscoVisc was injected to replace the aqueous and a  continuous tear curvilinear capsulorhexis was performed using a bent 25-gauge needle.  Balance salt on a syringe was used to perform hydro-dissection and phacoemulsification was carried out using a divide and conquer technique. Procedure(s) with comments: CATARACT EXTRACTION PHACO AND INTRAOCULAR LENS PLACEMENT ([facility]) (Right) - [place] 02:02 AP% 26.6 CDE 64.53 fluid pack lot # [id number]. Irrigation/aspiration was used to remove the residual cortex and the capsular bag was inflated with DiscoVisc. The intraocular lens was inserted into the capsular bag using a pre-loaded UltraSert Delivery System. Irrigation/aspiration was used to remove the residual DiscoVisc. The wound was inflated with balanced salt and checked for leaks. None were found. Miostat was injected via the paracentesis track and 0.1 ml of cefuroxime containing 1 mg of drug  was injected via the paracentesis track. The wound was checked for leaks again and none were found.   The bridal sutures were removed and two drops of Vigamox were placed on the eye. An eye shield was placed to protect the eye and the patient was discharged to the recovery area in good condition.   Keierra Nudo MD

## 2015-03-06 NOTE — H&P (Signed)
  See scanned notes.

## 2015-03-06 NOTE — Transfer of Care (Signed)
Immediate Anesthesia Transfer of Care Note  Patient: James Faulkner  Procedure(s) Performed: Procedure(s) with comments: CATARACT EXTRACTION PHACO AND INTRAOCULAR LENS PLACEMENT ([facility]) (Right) - [place] 02:02 AP% 26.6 CDE 64.53 fluid pack lot # [id number]  Patient Location: Short Stay  Anesthesia Type:MAC  Level of Consciousness: awake and alert   Airway & Oxygen Therapy: Patient Spontanous Breathing  Post-op Assessment: Report given to RN  Post vital signs: Reviewed  Last Vitals:  Filed Vitals:   03/06/15 0855 03/06/15 0857  BP: 158/68 158/68  Pulse: 60 60  Temp: 36.6 C 36.6 C  Resp: 16 16    Complications: No apparent anesthesia complications

## 2015-07-24 DIAGNOSIS — J410 Simple chronic bronchitis: Secondary | ICD-10-CM | POA: Diagnosis not present

## 2015-07-24 DIAGNOSIS — Z720 Tobacco use: Secondary | ICD-10-CM | POA: Diagnosis not present

## 2015-07-24 DIAGNOSIS — I119 Hypertensive heart disease without heart failure: Secondary | ICD-10-CM | POA: Diagnosis not present

## 2015-09-07 DIAGNOSIS — J410 Simple chronic bronchitis: Secondary | ICD-10-CM | POA: Diagnosis not present

## 2015-09-07 DIAGNOSIS — I119 Hypertensive heart disease without heart failure: Secondary | ICD-10-CM | POA: Diagnosis not present

## 2015-10-12 DIAGNOSIS — H40053 Ocular hypertension, bilateral: Secondary | ICD-10-CM | POA: Diagnosis not present

## 2015-10-23 DIAGNOSIS — J410 Simple chronic bronchitis: Secondary | ICD-10-CM | POA: Diagnosis not present

## 2015-10-23 DIAGNOSIS — I119 Hypertensive heart disease without heart failure: Secondary | ICD-10-CM | POA: Diagnosis not present

## 2015-10-23 DIAGNOSIS — Z720 Tobacco use: Secondary | ICD-10-CM | POA: Diagnosis not present

## 2015-10-23 DIAGNOSIS — H409 Unspecified glaucoma: Secondary | ICD-10-CM | POA: Diagnosis not present

## 2015-11-24 ENCOUNTER — Other Ambulatory Visit: Payer: Self-pay | Admitting: Otolaryngology

## 2015-11-24 DIAGNOSIS — R070 Pain in throat: Secondary | ICD-10-CM

## 2015-11-24 DIAGNOSIS — R42 Dizziness and giddiness: Secondary | ICD-10-CM | POA: Diagnosis not present

## 2015-11-24 DIAGNOSIS — R1310 Dysphagia, unspecified: Secondary | ICD-10-CM

## 2015-11-24 DIAGNOSIS — H9201 Otalgia, right ear: Secondary | ICD-10-CM

## 2015-11-30 ENCOUNTER — Ambulatory Visit: Payer: Medicare Other

## 2015-12-04 ENCOUNTER — Ambulatory Visit
Admission: RE | Admit: 2015-12-04 | Discharge: 2015-12-04 | Disposition: A | Discharge status: Home / Self Care | Payer: Medicare Other | Source: Ambulatory Visit | Attending: Otolaryngology | Admitting: Otolaryngology

## 2015-12-04 DIAGNOSIS — M47892 Other spondylosis, cervical region: Secondary | ICD-10-CM | POA: Insufficient documentation

## 2015-12-04 DIAGNOSIS — J439 Emphysema, unspecified: Secondary | ICD-10-CM | POA: Insufficient documentation

## 2015-12-04 DIAGNOSIS — I700 Atherosclerosis of aorta: Secondary | ICD-10-CM | POA: Insufficient documentation

## 2015-12-04 DIAGNOSIS — R1310 Dysphagia, unspecified: Secondary | ICD-10-CM | POA: Diagnosis not present

## 2015-12-04 DIAGNOSIS — R070 Pain in throat: Secondary | ICD-10-CM | POA: Diagnosis not present

## 2015-12-04 DIAGNOSIS — H9201 Otalgia, right ear: Secondary | ICD-10-CM | POA: Diagnosis not present

## 2015-12-04 LAB — POCT I-STAT CREATININE: CREATININE: 1 mg/dL (ref 0.61–1.24)

## 2015-12-04 MED ORDER — IOPAMIDOL (ISOVUE-300) INJECTION 61%
75.0000 mL | Freq: Once | INTRAVENOUS | Status: AC | PRN
  Administered 2015-12-04: 75 mL via INTRAVENOUS

## 2015-12-05 DIAGNOSIS — J410 Simple chronic bronchitis: Secondary | ICD-10-CM | POA: Diagnosis not present

## 2015-12-05 DIAGNOSIS — Z720 Tobacco use: Secondary | ICD-10-CM | POA: Diagnosis not present

## 2015-12-05 DIAGNOSIS — I119 Hypertensive heart disease without heart failure: Secondary | ICD-10-CM | POA: Diagnosis not present

## 2015-12-05 DIAGNOSIS — J019 Acute sinusitis, unspecified: Secondary | ICD-10-CM | POA: Diagnosis not present

## 2015-12-11 ENCOUNTER — Ambulatory Visit
Admission: RE | Admit: 2015-12-11 | Discharge: 2015-12-11 | Disposition: A | Discharge status: Home / Self Care | Payer: Medicare Other | Source: Ambulatory Visit | Attending: Otolaryngology | Admitting: Otolaryngology

## 2015-12-11 ENCOUNTER — Other Ambulatory Visit: Payer: Self-pay | Admitting: Otolaryngology

## 2015-12-11 DIAGNOSIS — M549 Dorsalgia, unspecified: Secondary | ICD-10-CM | POA: Insufficient documentation

## 2015-12-11 DIAGNOSIS — R918 Other nonspecific abnormal finding of lung field: Secondary | ICD-10-CM | POA: Diagnosis not present

## 2015-12-11 DIAGNOSIS — R05 Cough: Secondary | ICD-10-CM | POA: Diagnosis not present

## 2015-12-11 DIAGNOSIS — J449 Chronic obstructive pulmonary disease, unspecified: Secondary | ICD-10-CM | POA: Insufficient documentation

## 2015-12-12 ENCOUNTER — Other Ambulatory Visit: Payer: Self-pay | Admitting: Otolaryngology

## 2015-12-12 DIAGNOSIS — R222 Localized swelling, mass and lump, trunk: Secondary | ICD-10-CM

## 2015-12-19 DIAGNOSIS — J159 Unspecified bacterial pneumonia: Secondary | ICD-10-CM | POA: Diagnosis not present

## 2015-12-19 DIAGNOSIS — J410 Simple chronic bronchitis: Secondary | ICD-10-CM | POA: Diagnosis not present

## 2015-12-19 DIAGNOSIS — Z720 Tobacco use: Secondary | ICD-10-CM | POA: Diagnosis not present

## 2015-12-19 DIAGNOSIS — I119 Hypertensive heart disease without heart failure: Secondary | ICD-10-CM | POA: Diagnosis not present

## 2015-12-19 DIAGNOSIS — J180 Bronchopneumonia, unspecified organism: Secondary | ICD-10-CM | POA: Diagnosis not present

## 2016-01-01 ENCOUNTER — Ambulatory Visit
Admission: RE | Admit: 2016-01-01 | Discharge: 2016-01-01 | Disposition: A | Discharge status: Home / Self Care | Payer: Medicare Other | Source: Ambulatory Visit | Attending: Otolaryngology | Admitting: Otolaryngology

## 2016-01-01 DIAGNOSIS — R918 Other nonspecific abnormal finding of lung field: Secondary | ICD-10-CM | POA: Diagnosis not present

## 2016-01-01 DIAGNOSIS — I700 Atherosclerosis of aorta: Secondary | ICD-10-CM | POA: Insufficient documentation

## 2016-01-01 DIAGNOSIS — R222 Localized swelling, mass and lump, trunk: Secondary | ICD-10-CM

## 2016-01-01 DIAGNOSIS — I2510 Atherosclerotic heart disease of native coronary artery without angina pectoris: Secondary | ICD-10-CM | POA: Diagnosis not present

## 2016-01-01 DIAGNOSIS — J439 Emphysema, unspecified: Secondary | ICD-10-CM | POA: Insufficient documentation

## 2016-01-01 DIAGNOSIS — I714 Abdominal aortic aneurysm, without rupture: Secondary | ICD-10-CM | POA: Insufficient documentation

## 2016-01-01 MED ORDER — IOPAMIDOL (ISOVUE-300) INJECTION 61%
75.0000 mL | Freq: Once | INTRAVENOUS | Status: AC | PRN
  Administered 2016-01-01: 75 mL via INTRAVENOUS

## 2016-01-02 ENCOUNTER — Other Ambulatory Visit: Payer: Self-pay | Admitting: Otolaryngology

## 2016-01-02 DIAGNOSIS — R918 Other nonspecific abnormal finding of lung field: Secondary | ICD-10-CM

## 2016-01-02 DIAGNOSIS — D381 Neoplasm of uncertain behavior of trachea, bronchus and lung: Secondary | ICD-10-CM | POA: Diagnosis not present

## 2016-01-05 ENCOUNTER — Encounter
Admission: RE | Admit: 2016-01-05 | Discharge: 2016-01-05 | Disposition: A | Discharge status: Home / Self Care | Payer: Medicare Other | Source: Ambulatory Visit | Attending: Otolaryngology | Admitting: Otolaryngology

## 2016-01-05 DIAGNOSIS — R918 Other nonspecific abnormal finding of lung field: Secondary | ICD-10-CM | POA: Insufficient documentation

## 2016-01-05 LAB — GLUCOSE, CAPILLARY: Glucose-Capillary: 105 mg/dL — ABNORMAL HIGH (ref 65–99)

## 2016-01-05 MED ORDER — FLUDEOXYGLUCOSE F - 18 (FDG) INJECTION
11.8300 | Freq: Once | INTRAVENOUS | Status: AC | PRN
  Administered 2016-01-05: 11.83 via INTRAVENOUS

## 2016-01-09 ENCOUNTER — Institutional Professional Consult (permissible substitution): Payer: Self-pay | Admitting: Internal Medicine

## 2016-01-09 ENCOUNTER — Telehealth (INDEPENDENT_AMBULATORY_CARE_PROVIDER_SITE_OTHER): Payer: Self-pay

## 2016-01-09 NOTE — Telephone Encounter (Signed)
Patient called because he was confused as to why he was referred to Dr. Lucky Cowboy when he was told he would be referred to a pulmonologist, because he has a spot on his lungs and want someone to tell him what the results were from his CT. I let him know that he had an appointment to be seen for an aneurysm in our office and that a good idea would be to  call his PCP and have them explain the reasons for the appointments that were made.

## 2016-01-10 ENCOUNTER — Institutional Professional Consult (permissible substitution): Payer: Self-pay | Admitting: Internal Medicine

## 2016-01-11 ENCOUNTER — Encounter (INDEPENDENT_AMBULATORY_CARE_PROVIDER_SITE_OTHER): Payer: Self-pay

## 2016-01-11 ENCOUNTER — Encounter: Payer: Self-pay | Admitting: Internal Medicine

## 2016-01-11 ENCOUNTER — Ambulatory Visit (INDEPENDENT_AMBULATORY_CARE_PROVIDER_SITE_OTHER): Payer: Medicare Other | Admitting: Internal Medicine

## 2016-01-11 VITALS — BP 140/74 | HR 94 | Ht 68.0 in | Wt 157.0 lb

## 2016-01-11 DIAGNOSIS — R918 Other nonspecific abnormal finding of lung field: Secondary | ICD-10-CM

## 2016-01-11 NOTE — Patient Instructions (Signed)
Start BREO 200 Start Incruse CT guided Biopsy   Chronic Obstructive Pulmonary Disease Chronic obstructive pulmonary disease (COPD) is a common lung condition in which airflow from the lungs is limited. COPD is a general term that can be used to describe many different lung problems that limit airflow, including both chronic bronchitis and emphysema. If you have COPD, your lung function will probably never return to normal, but there are measures you can take to improve lung function and make yourself feel better. CAUSES   Smoking (common).  Exposure to secondhand smoke.  Genetic problems.  Chronic inflammatory lung diseases or recurrent infections. SYMPTOMS  Shortness of breath, especially with physical activity.  Deep, persistent (chronic) cough with a large amount of thick mucus.  Wheezing.  Rapid breaths (tachypnea).  Gray or bluish discoloration (cyanosis) of the skin, especially in your fingers, toes, or lips.  Fatigue.  Weight loss.  Frequent infections or episodes when breathing symptoms become much worse (exacerbations).  Chest tightness. DIAGNOSIS Your health care provider will take a medical history and perform a physical examination to diagnose COPD. Additional tests for COPD may include:  Lung (pulmonary) function tests.  Chest X-ray.  CT scan.  Blood tests. TREATMENT  Treatment for COPD may include:  Inhaler and nebulizer medicines. These help manage the symptoms of COPD and make your breathing more comfortable.  Supplemental oxygen. Supplemental oxygen is only helpful if you have a low oxygen level in your blood.  Exercise and physical activity. These are beneficial for nearly all people with COPD.  Lung surgery or transplant.  Nutrition therapy to gain weight, if you are underweight.  Pulmonary rehabilitation. This may involve working with a team of health care providers and specialists, such as respiratory, occupational, and physical  therapists. HOME CARE INSTRUCTIONS  Take all medicines (inhaled or pills) as directed by your health care provider.  Avoid over-the-counter medicines or cough syrups that dry up your airway (such as antihistamines) and slow down the elimination of secretions unless instructed otherwise by your health care provider.  If you are a smoker, the most important thing that you can do is stop smoking. Continuing to smoke will cause further lung damage and breathing trouble. Ask your health care provider for help with quitting smoking. He or she can direct you to community resources or hospitals that provide support.  Avoid exposure to irritants such as smoke, chemicals, and fumes that aggravate your breathing.  Use oxygen therapy and pulmonary rehabilitation if directed by your health care provider. If you require home oxygen therapy, ask your health care provider whether you should purchase a pulse oximeter to measure your oxygen level at home.  Avoid contact with individuals who have a contagious illness.  Avoid extreme temperature and humidity changes.  Eat healthy foods. Eating smaller, more frequent meals and resting before meals may help you maintain your strength.  Stay active, but balance activity with periods of rest. Exercise and physical activity will help you maintain your ability to do things you want to do.  Preventing infection and hospitalization is very important when you have COPD. Make sure to receive all the vaccines your health care provider recommends, especially the pneumococcal and influenza vaccines. Ask your health care provider whether you need a pneumonia vaccine.  Learn and use relaxation techniques to manage stress.  Learn and use controlled breathing techniques as directed by your health care provider. Controlled breathing techniques include:  Pursed lip breathing. Start by breathing in (inhaling) through your nose for  1 second. Then, purse your lips as if you were  going to whistle and breathe out (exhale) through the pursed lips for 2 seconds.  Diaphragmatic breathing. Start by putting one hand on your abdomen just above your waist. Inhale slowly through your nose. The hand on your abdomen should move out. Then purse your lips and exhale slowly. You should be able to feel the hand on your abdomen moving in as you exhale.  Learn and use controlled coughing to clear mucus from your lungs. Controlled coughing is a series of short, progressive coughs. The steps of controlled coughing are: 1. Lean your head slightly forward. 2. Breathe in deeply using diaphragmatic breathing. 3. Try to hold your breath for 3 seconds. 4. Keep your mouth slightly open while coughing twice. 5. Spit any mucus out into a tissue. 6. Rest and repeat the steps once or twice as needed. SEEK MEDICAL CARE IF:  You are coughing up more mucus than usual.  There is a change in the color or thickness of your mucus.  Your breathing is more labored than usual.  Your breathing is faster than usual. SEEK IMMEDIATE MEDICAL CARE IF:  You have shortness of breath while you are resting.  You have shortness of breath that prevents you from:  Being able to talk.  Performing your usual physical activities.  You have chest pain lasting longer than 5 minutes.  Your skin color is more cyanotic than usual.  You measure low oxygen saturations for longer than 5 minutes with a pulse oximeter. MAKE SURE YOU:  Understand these instructions.  Will watch your condition.  Will get help right away if you are not doing well or get worse.   This information is not intended to replace advice given to you by your health care provider. Make sure you discuss any questions you have with your health care provider.   Document Released: 12/12/2004 Document Revised: 03/25/2014 Document Reviewed: 10/29/2012 Elsevier Interactive Patient Education [employer].

## 2016-01-11 NOTE — Progress Notes (Signed)
[facility] Consultation      Date: 01/11/2016,   MRN# [id number] James Faulkner. [birth year] Code Status:  Code Status History    This patient does not have a recorded code status. Please follow your organizational policy for patients in this situation.     Hosp day:'@[email or web address]'$ @ Referring MD: '@ATDPROV'$ @     PCP:      AdmissionWeight: 157 lb (71.2 kg)                 CurrentWeight: 157 lb (71.2 kg) James Faulkner. is a 75 y.o. old male seen in consultation for RT lung mass at the request of Dr. Pryor Ochoa.     CHIEF COMPLAINT:   Fatigued/tired   HISTORY OF PRESENT ILLNESS   75 yo white male with long standing smoking hisory 1 ppd for 60 years initially had throat pain and had seen ENT  Upon further examination, patient has complained of RT sided chest wall pain going on for 3 weeks Patient underwent CT chest which shows Large RT lower lobe Lung mass images reviewed 01/11/2016 Patient has chronic SOB and DOE for many years , states that he has been dx with COPD but does NOT take any inhlaers Patient also had lip and tongue swelling yesterday and had resolved with time-patient is on ACE INH  Patient has been having extreme fatigue and tiredness, along with 15 pound weight loss No signs of infection at this time   PAST MEDICAL HISTORY   Past Medical History:  Diagnosis Date  . Arthritis   . BPH (benign prostatic hyperplasia)   . COPD (chronic obstructive pulmonary disease) ([place])   . Cough    chronic  . GERD (gastroesophageal reflux disease)    h/o bleeding ulcer  . Hypertension      SURGICAL HISTORY   Past Surgical History:  Procedure Laterality Date  . APPENDECTOMY    . CATARACT EXTRACTION W/PHACO Right 03/06/2015   Procedure: CATARACT EXTRACTION PHACO AND INTRAOCULAR LENS PLACEMENT ([place]);  Surgeon: Estill Cotta, MD;  Location: [facility] ORS;  Service: Ophthalmology;  Laterality: Right;  [place] 02:02 AP% 26.6 CDE 64.53 fluid pack lot #  [id number]  . EYE SURGERY    . TONSILLECTOMY       FAMILY HISTORY   History reviewed. No pertinent family history.   SOCIAL HISTORY   Social History  Substance Use Topics  . Smoking status: Current Every Day Smoker    Packs/day: 1.00  . Smokeless tobacco: Current User    Types: Chew  . Alcohol use Yes     MEDICATIONS    Home Medication:  Current Outpatient Rx  . Order #: [id number] Class: Historical Med  . Order #: [id number] Class: Historical Med  . Order #: [id number] Class: Historical Med  . Order #: [id number] Class: Historical Med    Current Medication:  Current Outpatient Prescriptions:  .  Bimatoprost (LUMIGAN OP), Apply to eye., Disp: , Rfl:  .  gabapentin (NEURONTIN) 100 MG capsule, Take 100 mg by mouth 3 (three) times daily., Disp: , Rfl:  .  LISINOPRIL-HYDROCHLOROTHIAZIDE PO, Take by mouth daily., Disp: , Rfl:  .  pantoprazole (PROTONIX) 40 MG tablet, Take 40 mg by mouth daily., Disp: , Rfl:     ALLERGIES   Sulfa antibiotics     REVIEW OF SYSTEMS   Review of Systems  Constitutional: Negative for chills, diaphoresis, fever, malaise/fatigue and weight loss.  HENT: Negative for congestion and hearing loss.   Eyes: Negative for blurred vision and double vision.  Respiratory: Positive for shortness of breath and wheezing. Negative for cough, hemoptysis and sputum production.   Cardiovascular: Negative for chest pain, palpitations, orthopnea and leg swelling.  Gastrointestinal: Negative for abdominal pain, heartburn, nausea and vomiting.  Musculoskeletal: Negative for back pain, myalgias and neck pain.  Skin: Negative for rash.  Neurological: Negative for dizziness, tingling, tremors, weakness and headaches.  Endo/Heme/Allergies: Does not bruise/bleed easily.  Psychiatric/Behavioral: Negative for depression, substance abuse and suicidal ideas.  All other systems reviewed and are negative.    VS: BP 140/74 (BP Location: Left Arm, Cuff Size: Normal)    Pulse 94   Ht '5\' 8"'$  (1.727 m)   Wt 157 lb (71.2 kg)   SpO2 92%   BMI 23.87 kg/m      PHYSICAL EXAM  Physical Exam  Constitutional: He is oriented to person, place, and time. He appears well-developed and well-nourished. No distress.  HENT:  Head: Normocephalic and atraumatic.  Mouth/Throat: No oropharyngeal exudate.  Eyes: EOM are normal. Pupils are equal, round, and reactive to light. No scleral icterus.  Neck: Normal range of motion. Neck supple.  Cardiovascular: Normal rate, regular rhythm and normal heart sounds.   No murmur heard. Pulmonary/Chest: No stridor. No respiratory distress. He has no wheezes.  Abdominal: Soft. Bowel sounds are normal.  Musculoskeletal: Normal range of motion. He exhibits no edema.  Neurological: He is alert and oriented to person, place, and time. No cranial nerve deficit.  Skin: Skin is warm. He is not diaphoretic.  Psychiatric: He has a normal mood and affect.         IMAGING    Ct Chest W Contrast  Result Date: 01/01/2016 CLINICAL DATA:  Cough and shortness of breath. Sixty pack-year history. Current smoker. EXAM: CT CHEST WITH CONTRAST TECHNIQUE: Multidetector CT imaging of the chest was performed during intravenous contrast administration. CONTRAST:  102m ISOVUE-300 IOPAMIDOL (ISOVUE-300) INJECTION 61% COMPARISON:  None FINDINGS: Cardiovascular: The heart size appears normal. No pericardial effusion. Aortic atherosclerosis is identified. Calcification within the RCA, LAD coronary arteries identified. Mediastinum/Nodes: The trachea appears patent and is midline. The esophagus appears within normal limits. The sub- carinal lymph node is enlarged measuring 1.8 cm, image 86 of series 2. Left paratracheal lymph node measures 8 mm, image 63 of series 2. Enlarged right hilar lymph nodes identified. Index node measures 2.7 cm, image 93 of series 2. Lungs/Pleura: Advanced changes of centrilobular and paraseptal emphysema. There is a a small to moderate  right pleural effusion. Mass within the right lower lobe appears centrally necrotic measuring 8.3 x 7.8 x 6.5 cm. Interlobular septal thickening within the surrounding right lower lobe is identified and worrisome for lymphangitic spread of tumor. There are several nodules identified elsewhere within the right lung as well as within the left lung. Index nodule within the left lower lobe measures 6 mm, image 92 of series 3. Also in the left lower lobe is a nodule measuring 9 mm, image 121 of series 3. Upper Abdomen: No acute abnormality. Aortic atherosclerosis. The infrarenal abdominal aorta measures at least 3.4 cm in AP dimension, image 174 of series 2. Musculoskeletal: No chest wall abnormality. No acute or significant osseous findings.Spondylosis noted within the thoracic spine. IMPRESSION: 1. Right lower lobe lung mass is identified and highly suspicious for primary bronchogenic carcinoma. Assuming non-small cell histology findings compatible with a T4N2M1a lesion. Further investigation with PET-CT is advised. 2. Emphysema 3. Aortic atherosclerosis and multi vessel coronary artery calcification. 4. Infrarenal abdominal aortic aneurysm. Recommend followup by  ultrasound in 3 years. This recommendation follows ACR consensus guidelines: White Paper of the ACR Incidental Findings Committee II on Vascular Findings. J Am Coll Radiol 2013; 71:062-694 5. These results will be called to the ordering clinician or representative by the Radiologist Assistant, and communication documented in the PACS or zVision Dashboard. Electronically Signed   By: Kerby Moors M.D.   On: 01/01/2016 10:04   Nm Pet Image Initial (pi) Skull Base To Thigh  Result Date: 01/05/2016 CLINICAL DATA:  Initial treatment strategy for right lower lobe lung mass. EXAM: NUCLEAR MEDICINE PET SKULL BASE TO THIGH TECHNIQUE: 11.8 mCi F-18 FDG was injected intravenously. Full-ring PET imaging was performed from the skull base to thigh after the  radiotracer. CT data was obtained and used for attenuation correction and anatomic localization. FASTING BLOOD GLUCOSE:  Value: 105 mg/dl COMPARISON:  CT chest 01/01/2016. FINDINGS: NECK No hypermetabolic lymph nodes in the neck. CHEST Necrotic right lower lobe lung mass is markedly hypermetabolic with SUV max = 85.4. 18 mm subcarinal lymph node measured on the previous diagnostic CT is hypermetabolic with SUV max = 62.7. 2.7 cm short axis right hilar lymph node shows hypermetabolism with SUV max = 14.5. 8 mm AP window/ left paratracheal lymph node measured on the prior study shows only low level hypermetabolism with SUV max = 2.7. No detectable hypermetabolism in the left lower lobe pulmonary nodules although size of these nodules is below the accepted threshold for reliable resolution by PET imaging. Small right pleural effusion not substantially changed in the interval. ABDOMEN/PELVIS No abnormal hypermetabolic activity within the liver, pancreas, adrenal glands, or spleen. No hypermetabolic lymph nodes in the abdomen or pelvis. 5 cm infrarenal abdominal aortic aneurysm noted. Image 164 series 3 shows a displaced curvilinear calcification which could be related to chronic dissection although calcification of chronic mural thrombus would also be a consideration. Nonobstructing stones identified lower pole left kidney. SKELETON No focal hypermetabolic activity to suggest skeletal metastasis. IMPRESSION: 1. Markedly hypermetabolic centrally necrotic right lower lobe lung mass consistent with malignancy. Hypermetabolic metastatic disease is seen in the right hilum and subcarinal station. 2. No evidence for hypermetabolism in the AP window lymph node and left lower lobe pulmonary nodules, although these lesions are below the accepted size threshold for reliable resolution on PET imaging. 3. 5.0 cm infrarenal abdominal aortic aneurys Recommend followup by abdomen and pelvis CTA in 3-6 months, and vascular surgery  referral/consultation if not already obtained. This recommendation follows ACR consensus guidelines: White Paper of the ACR Incidental Findings Committee II on Vascular Findings. Natasha Mead Coll Radiol 2013; 03:500-938. m Electronically Signed   By: Misty Stanley M.D.   On: 01/05/2016 10:56     Images reviewed 01/11/2016 RT lung mass with adenopathy  After further review of imaging, the RT lung mass is peripheral which I think is amendable to CT guided biopsy   ASSESSMENT/PLAN   75 yo white male with long standinig smoking history with RT lung mass with adenopathy findings c/w Primary lung cancer with underlying COPD  After further assessment, patient is at moderate to high risk for pulmonary complications if he were to undergo general anesthesia and Bronchoscopy I would recommend CT guided Biopsy  1.will order CT guided Biopsy 2.start BREO 200 3.start Incruse 4.albuterol as needed 5.follow up onology   I discussed case with Dr. Kathlene Cote with IR and he has approved the procedure for CT guided Biopsy   I have personally obtained a history, examined the patient, evaluated laboratory  and independently reviewed imaging results, formulated the assessment and plan and placed orders.  The Patient requires high complexity decision making for assessment and support, frequent evaluation and titration of therapies, application of advanced monitoring technologies and extensive interpretation of multiple databases.   Patient/Family are satisfied with Plan of action and management. All questions answered  Corrin Parker, M.D.  [facility]  Medical Director [place] Director [facility] Cardio-Pulmonary Department

## 2016-01-12 ENCOUNTER — Telehealth: Payer: Self-pay | Admitting: Internal Medicine

## 2016-01-12 NOTE — Telephone Encounter (Signed)
Patient was instructed by nurse and was given samples of inhalers to take home, they should last for about 3 weeks I have no other recommendations. Patient will need to call next week

## 2016-01-12 NOTE — Telephone Encounter (Signed)
Per last OV note pt was to start breo 200 and incruse.  How should the pt take these meds.  Please advise so these can be sent in for the pt. Thanks.

## 2016-01-12 NOTE — Telephone Encounter (Signed)
Pt calling stating we were to send in some inhaler prescriptions to [place] But patient called this morning and they didn't have anything.  Please advise

## 2016-01-15 DIAGNOSIS — R918 Other nonspecific abnormal finding of lung field: Secondary | ICD-10-CM | POA: Insufficient documentation

## 2016-01-15 NOTE — Progress Notes (Signed)
[place]  [phone] [phone]  ID: James Faulkner. OB: 1940/07/20  MR#: [id number]  BVQ#:[id number]  Patient Care Team: [name], MD as PCP - General (Internal Medicine)  CHIEF COMPLAINT: Right lower lobe lung mass.  INTERVAL HISTORY: Patient is a 75 year old male who presented to ENT complaining of right-sided neck pain and occasional flank pain. Subsequent workup included a CT of the chest which revealed a large right lower lobe lung mass. Patient has also had a PET scan which was highly suspicious for metastatic in his right hilum and subcarinal lymph nodes. Patient was evaluated by pulmonary and was determined to be too high risk for bronchoscopy, therefore has a CT-guided biopsy scheduled for January 18, 2016. Patient admits to depression and anxiety, but otherwise feels well. He has no neurologic complaints. He denies any recent fevers or illnesses. He has a fair appetite, but denies weight loss. He denies any cough, hemoptysis, or shortness of breath. He has no nausea, vomiting, constipation, or diarrhea. He has no urinary complaints. Patient otherwise feels well and offers no further specific complaints.  REVIEW OF SYSTEMS:   Review of Systems  Constitutional: Negative.  Negative for fever, malaise/fatigue and weight loss.  Respiratory: Negative.  Negative for cough, hemoptysis and shortness of breath.   Cardiovascular: Negative.  Negative for chest pain and leg swelling.  Gastrointestinal: Negative.  Negative for abdominal pain.  Genitourinary: Negative.   Musculoskeletal: Negative.   Neurological: Negative.  Negative for weakness.  Psychiatric/Behavioral: Positive for depression and suicidal ideas. The patient is nervous/anxious.    As per HPI. Otherwise, a complete review of systems is negative.   PAST MEDICAL HISTORY: Past Medical History:  Diagnosis Date  . Arthritis   . BPH (benign prostatic hyperplasia)   . COPD (chronic  obstructive pulmonary disease) ([place])   . Cough    chronic  . GERD (gastroesophageal reflux disease)    h/o bleeding ulcer  . Hypertension     PAST SURGICAL HISTORY: Past Surgical History:  Procedure Laterality Date  . APPENDECTOMY    . CATARACT EXTRACTION W/PHACO Right 03/06/2015   Procedure: CATARACT EXTRACTION PHACO AND INTRAOCULAR LENS PLACEMENT ([place]);  Surgeon: Estill Cotta, MD;  Location: [facility] ORS;  Service: Ophthalmology;  Laterality: Right;  [place] 02:02 AP% 26.6 CDE 64.53 fluid pack lot # [id number]  . EYE SURGERY    . TONSILLECTOMY      FAMILY HISTORY: History reviewed. No pertinent family history.  ADVANCED DIRECTIVES (Y/N):  N  HEALTH MAINTENANCE: Social History  Substance Use Topics  . Smoking status: Current Every Day Smoker    Packs/day: 1.00  . Smokeless tobacco: Current User    Types: Chew  . Alcohol use Yes     Colonoscopy:  PAP:  Bone density:  Lipid panel:  Allergies  Allergen Reactions  . Sulfa Antibiotics     Current Outpatient Prescriptions  Medication Sig Dispense Refill  . amLODipine (NORVASC) 5 MG tablet Take 1 tablet by mouth daily.    . fluticasone furoate-vilanterol (BREO ELLIPTA) 200-25 MCG/INH AEPB Inhale 1 puff into the lungs daily.    Marland Kitchen gabapentin (NEURONTIN) 100 MG capsule Take 100 mg by mouth 3 (three) times daily.    . pantoprazole (PROTONIX) 40 MG tablet Take 40 mg by mouth daily.    Marland Kitchen umeclidinium bromide (INCRUSE ELLIPTA) 62.5 MCG/INH AEPB Inhale 1 puff into the lungs daily.    . fluticasone furoate-vilanterol (BREO ELLIPTA) 200-25 MCG/INH AEPB Inhale 1 puff into the lungs daily.  60 each 5  . umeclidinium bromide (INCRUSE ELLIPTA) 62.5 MCG/INH AEPB Inhale 1 puff into the lungs daily. 30 each 5   No current facility-administered medications for this visit.     OBJECTIVE: Vitals:   01/16/16 1342  BP: (!) 159/70  Pulse: 87  Resp: 18  Temp: 97.5 F (36.4 C)     Body mass index is 23.92 kg/m.    ECOG FS:0 -  Asymptomatic  General: Well-developed, well-nourished, no acute distress. Eyes: Pink conjunctiva, anicteric sclera. HEENT: Normocephalic, moist mucous membranes, clear oropharnyx. Lungs: Clear to auscultation bilaterally. Heart: Regular rate and rhythm. No rubs, murmurs, or gallops. Abdomen: Soft, nontender, nondistended. No organomegaly noted, normoactive bowel sounds. Musculoskeletal: No edema, cyanosis, or clubbing. Neuro: Alert, answering all questions appropriately. Cranial nerves grossly intact. Skin: No rashes or petechiae noted. Psych: Flat affect. Lymphatics: No cervical, calvicular, axillary or inguinal LAD.   LAB RESULTS:  Lab Results  Component Value Date   NA 144 04/25/2012   K 3.5 04/25/2012   CL 113 (H) 04/25/2012   CO2 21 04/25/2012   GLUCOSE 84 04/25/2012   BUN 13 04/25/2012   CREATININE 1.00 12/04/2015   CALCIUM 7.4 (L) 04/25/2012   PROT 5.3 (L) 04/22/2012   ALBUMIN 2.8 (L) 04/22/2012   AST 20 04/22/2012   ALT 19 04/22/2012   ALKPHOS 56 04/22/2012   BILITOT 0.4 04/22/2012   GFRNONAA >60 04/25/2012   GFRAA >60 04/25/2012    Lab Results  Component Value Date   WBC 11.2 (H) 04/25/2012   NEUTROABS 8.1 (H) 04/25/2012   HGB 9.2 (L) 04/26/2012   HCT 26.6 (L) 04/25/2012   MCV 87 04/25/2012   PLT 171 04/25/2012     STUDIES: Ct Chest W Contrast  Result Date: 01/01/2016 CLINICAL DATA:  Cough and shortness of breath. Sixty pack-year history. Current smoker. EXAM: CT CHEST WITH CONTRAST TECHNIQUE: Multidetector CT imaging of the chest was performed during intravenous contrast administration. CONTRAST:  78m ISOVUE-300 IOPAMIDOL (ISOVUE-300) INJECTION 61% COMPARISON:  None FINDINGS: Cardiovascular: The heart size appears normal. No pericardial effusion. Aortic atherosclerosis is identified. Calcification within the RCA, LAD coronary arteries identified. Mediastinum/Nodes: The trachea appears patent and is midline. The esophagus appears within normal limits. The  sub- carinal lymph node is enlarged measuring 1.8 cm, image 86 of series 2. Left paratracheal lymph node measures 8 mm, image 63 of series 2. Enlarged right hilar lymph nodes identified. Index node measures 2.7 cm, image 93 of series 2. Lungs/Pleura: Advanced changes of centrilobular and paraseptal emphysema. There is a a small to moderate right pleural effusion. Mass within the right lower lobe appears centrally necrotic measuring 8.3 x 7.8 x 6.5 cm. Interlobular septal thickening within the surrounding right lower lobe is identified and worrisome for lymphangitic spread of tumor. There are several nodules identified elsewhere within the right lung as well as within the left lung. Index nodule within the left lower lobe measures 6 mm, image 92 of series 3. Also in the left lower lobe is a nodule measuring 9 mm, image 121 of series 3. Upper Abdomen: No acute abnormality. Aortic atherosclerosis. The infrarenal abdominal aorta measures at least 3.4 cm in AP dimension, image 174 of series 2. Musculoskeletal: No chest wall abnormality. No acute or significant osseous findings.Spondylosis noted within the thoracic spine. IMPRESSION: 1. Right lower lobe lung mass is identified and highly suspicious for primary bronchogenic carcinoma. Assuming non-small cell histology findings compatible with a T4N2M1a lesion. Further investigation with PET-CT is advised. 2. Emphysema  3. Aortic atherosclerosis and multi vessel coronary artery calcification. 4. Infrarenal abdominal aortic aneurysm. Recommend followup by ultrasound in 3 years. This recommendation follows ACR consensus guidelines: White Paper of the ACR Incidental Findings Committee II on Vascular Findings. J Am Coll Radiol 2013; 62:694-854 5. These results will be called to the ordering clinician or representative by the Radiologist Assistant, and communication documented in the PACS or zVision Dashboard. Electronically Signed   By: Kerby Moors M.D.   On: 01/01/2016 10:04    Nm Pet Image Initial (pi) Skull Base To Thigh  Result Date: 01/05/2016 CLINICAL DATA:  Initial treatment strategy for right lower lobe lung mass. EXAM: NUCLEAR MEDICINE PET SKULL BASE TO THIGH TECHNIQUE: 11.8 mCi F-18 FDG was injected intravenously. Full-ring PET imaging was performed from the skull base to thigh after the radiotracer. CT data was obtained and used for attenuation correction and anatomic localization. FASTING BLOOD GLUCOSE:  Value: 105 mg/dl COMPARISON:  CT chest 01/01/2016. FINDINGS: NECK No hypermetabolic lymph nodes in the neck. CHEST Necrotic right lower lobe lung mass is markedly hypermetabolic with SUV max = 62.7. 18 mm subcarinal lymph node measured on the previous diagnostic CT is hypermetabolic with SUV max = 03.5. 2.7 cm short axis right hilar lymph node shows hypermetabolism with SUV max = 14.5. 8 mm AP window/ left paratracheal lymph node measured on the prior study shows only low level hypermetabolism with SUV max = 2.7. No detectable hypermetabolism in the left lower lobe pulmonary nodules although size of these nodules is below the accepted threshold for reliable resolution by PET imaging. Small right pleural effusion not substantially changed in the interval. ABDOMEN/PELVIS No abnormal hypermetabolic activity within the liver, pancreas, adrenal glands, or spleen. No hypermetabolic lymph nodes in the abdomen or pelvis. 5 cm infrarenal abdominal aortic aneurysm noted. Image 164 series 3 shows a displaced curvilinear calcification which could be related to chronic dissection although calcification of chronic mural thrombus would also be a consideration. Nonobstructing stones identified lower pole left kidney. SKELETON No focal hypermetabolic activity to suggest skeletal metastasis. IMPRESSION: 1. Markedly hypermetabolic centrally necrotic right lower lobe lung mass consistent with malignancy. Hypermetabolic metastatic disease is seen in the right hilum and subcarinal station. 2.  No evidence for hypermetabolism in the AP window lymph node and left lower lobe pulmonary nodules, although these lesions are below the accepted size threshold for reliable resolution on PET imaging. 3. 5.0 cm infrarenal abdominal aortic aneurys Recommend followup by abdomen and pelvis CTA in 3-6 months, and vascular surgery referral/consultation if not already obtained. This recommendation follows ACR consensus guidelines: White Paper of the ACR Incidental Findings Committee II on Vascular Findings. Natasha Mead Coll Radiol 2013; 00:938-182. m Electronically Signed   By: Misty Stanley M.D.   On: 01/05/2016 10:56    ASSESSMENT: Right lower lobe lung mass  PLAN:   1.  Right lower lobe lung mass: CT and PET scan reviewed independently. CT of the chest revealed a large right lower lobe lung mass. PET scan which was highly suspicious for metastatic in his right hilum and subcarinal lymph nodes concerning for at least stage III disease. Patient was evaluated by pulmonary and was determined to be too high risk for bronchoscopy, therefore has a CT-guided biopsy scheduled for January 18, 2016. MRI of the brain has been ordered to complete the staging workup. Patient will return to clinic on January 25, 2016 to discuss his biopsy results and treatment planning.  2. Depression: Patient admits to suicidal ideation  in the past, but none currently. He was offered antidepressants but has declined at this time. Will readdress at next clinic visit.  Approximately 45 minutes was spent in discussion of which greater than 50% was consultation.  Patient expressed understanding and was in agreement with this plan. He also understands that He can call clinic at any time with any questions, concerns, or complaints.   No matching staging information was found for the patient.  Lloyd Huger, MD   01/17/2016 11:28 AM

## 2016-01-16 ENCOUNTER — Inpatient Hospital Stay: Payer: Medicare Other | Attending: Oncology | Admitting: Oncology

## 2016-01-16 ENCOUNTER — Encounter: Payer: Self-pay | Admitting: Oncology

## 2016-01-16 DIAGNOSIS — F17210 Nicotine dependence, cigarettes, uncomplicated: Secondary | ICD-10-CM | POA: Diagnosis not present

## 2016-01-16 DIAGNOSIS — R05 Cough: Secondary | ICD-10-CM | POA: Diagnosis not present

## 2016-01-16 DIAGNOSIS — J439 Emphysema, unspecified: Secondary | ICD-10-CM | POA: Insufficient documentation

## 2016-01-16 DIAGNOSIS — M129 Arthropathy, unspecified: Secondary | ICD-10-CM | POA: Insufficient documentation

## 2016-01-16 DIAGNOSIS — R109 Unspecified abdominal pain: Secondary | ICD-10-CM | POA: Insufficient documentation

## 2016-01-16 DIAGNOSIS — F419 Anxiety disorder, unspecified: Secondary | ICD-10-CM | POA: Insufficient documentation

## 2016-01-16 DIAGNOSIS — J449 Chronic obstructive pulmonary disease, unspecified: Secondary | ICD-10-CM

## 2016-01-16 DIAGNOSIS — N400 Enlarged prostate without lower urinary tract symptoms: Secondary | ICD-10-CM | POA: Diagnosis not present

## 2016-01-16 DIAGNOSIS — R918 Other nonspecific abnormal finding of lung field: Secondary | ICD-10-CM | POA: Diagnosis not present

## 2016-01-16 DIAGNOSIS — I2510 Atherosclerotic heart disease of native coronary artery without angina pectoris: Secondary | ICD-10-CM | POA: Diagnosis not present

## 2016-01-16 DIAGNOSIS — R591 Generalized enlarged lymph nodes: Secondary | ICD-10-CM | POA: Diagnosis not present

## 2016-01-16 DIAGNOSIS — M542 Cervicalgia: Secondary | ICD-10-CM | POA: Diagnosis not present

## 2016-01-16 DIAGNOSIS — Z79899 Other long term (current) drug therapy: Secondary | ICD-10-CM | POA: Diagnosis not present

## 2016-01-16 DIAGNOSIS — K219 Gastro-esophageal reflux disease without esophagitis: Secondary | ICD-10-CM | POA: Insufficient documentation

## 2016-01-16 DIAGNOSIS — F329 Major depressive disorder, single episode, unspecified: Secondary | ICD-10-CM | POA: Diagnosis not present

## 2016-01-16 DIAGNOSIS — I10 Essential (primary) hypertension: Secondary | ICD-10-CM | POA: Diagnosis not present

## 2016-01-16 DIAGNOSIS — J90 Pleural effusion, not elsewhere classified: Secondary | ICD-10-CM | POA: Diagnosis not present

## 2016-01-16 DIAGNOSIS — I714 Abdominal aortic aneurysm, without rupture: Secondary | ICD-10-CM

## 2016-01-16 NOTE — Progress Notes (Signed)
New evaluation for lung mass. Complains of feeling depressed today. Has productive cough with yellow sputum.

## 2016-01-17 ENCOUNTER — Other Ambulatory Visit: Payer: Self-pay | Admitting: Radiology

## 2016-01-17 MED ORDER — UMECLIDINIUM BROMIDE 62.5 MCG/INH IN AEPB: 1.0000 | INHALATION_SPRAY | Freq: Every day | RESPIRATORY_TRACT | 5 refills | Status: AC

## 2016-01-17 MED ORDER — FLUTICASONE FUROATE-VILANTEROL 200-25 MCG/INH IN AEPB: 1.0000 | INHALATION_SPRAY | Freq: Every day | RESPIRATORY_TRACT | 5 refills | Status: AC

## 2016-01-17 NOTE — Telephone Encounter (Signed)
Pt informed medications sent to [facility]. Nothing further needed.

## 2016-01-17 NOTE — Telephone Encounter (Signed)
Pt calling stating he took his last dose this morning that the samples only had about 6 doses in it. Pt states he is only taking one dose on each inhaler. And on the incuse he is out. The other has a few left.  Please advise

## 2016-01-18 ENCOUNTER — Ambulatory Visit
Admission: RE | Admit: 2016-01-18 | Discharge: 2016-01-18 | Disposition: A | Discharge status: Home / Self Care | Payer: Medicare Other | Source: Ambulatory Visit | Attending: Internal Medicine | Admitting: Internal Medicine

## 2016-01-18 ENCOUNTER — Ambulatory Visit
Admission: RE | Admit: 2016-01-18 | Discharge: 2016-01-18 | Disposition: A | Discharge status: Home / Self Care | Payer: Medicare Other | Source: Ambulatory Visit | Attending: Interventional Radiology | Admitting: Interventional Radiology

## 2016-01-18 DIAGNOSIS — C3431 Malignant neoplasm of lower lobe, right bronchus or lung: Secondary | ICD-10-CM | POA: Diagnosis not present

## 2016-01-18 DIAGNOSIS — R918 Other nonspecific abnormal finding of lung field: Secondary | ICD-10-CM

## 2016-01-18 DIAGNOSIS — J449 Chronic obstructive pulmonary disease, unspecified: Secondary | ICD-10-CM | POA: Insufficient documentation

## 2016-01-18 DIAGNOSIS — K219 Gastro-esophageal reflux disease without esophagitis: Secondary | ICD-10-CM | POA: Insufficient documentation

## 2016-01-18 DIAGNOSIS — F17210 Nicotine dependence, cigarettes, uncomplicated: Secondary | ICD-10-CM | POA: Diagnosis not present

## 2016-01-18 DIAGNOSIS — Z9889 Other specified postprocedural states: Secondary | ICD-10-CM

## 2016-01-18 DIAGNOSIS — Z7689 Persons encountering health services in other specified circumstances: Secondary | ICD-10-CM | POA: Diagnosis not present

## 2016-01-18 DIAGNOSIS — I10 Essential (primary) hypertension: Secondary | ICD-10-CM | POA: Diagnosis not present

## 2016-01-18 DIAGNOSIS — N400 Enlarged prostate without lower urinary tract symptoms: Secondary | ICD-10-CM | POA: Diagnosis not present

## 2016-01-18 DIAGNOSIS — J90 Pleural effusion, not elsewhere classified: Secondary | ICD-10-CM | POA: Diagnosis not present

## 2016-01-18 DIAGNOSIS — M1990 Unspecified osteoarthritis, unspecified site: Secondary | ICD-10-CM | POA: Diagnosis not present

## 2016-01-18 DIAGNOSIS — R911 Solitary pulmonary nodule: Secondary | ICD-10-CM | POA: Diagnosis not present

## 2016-01-18 LAB — CBC
HEMATOCRIT: 36.2 % — AB (ref 40.0–52.0)
Hemoglobin: 12.3 g/dL — ABNORMAL LOW (ref 13.0–18.0)
MCH: 30.4 pg (ref 26.0–34.0)
MCHC: 34 g/dL (ref 32.0–36.0)
MCV: 89.4 fL (ref 80.0–100.0)
PLATELETS: 336 10*3/uL (ref 150–440)
RBC: 4.05 MIL/uL — ABNORMAL LOW (ref 4.40–5.90)
RDW: 11.8 % (ref 11.5–14.5)
WBC: 11.7 10*3/uL — AB (ref 3.8–10.6)

## 2016-01-18 LAB — APTT: APTT: 36 s (ref 24–36)

## 2016-01-18 LAB — PROTIME-INR
INR: 0.97
Prothrombin Time: 12.9 seconds (ref 11.4–15.2)

## 2016-01-18 MED ORDER — SODIUM CHLORIDE 0.9 % IV SOLN
INTRAVENOUS | Status: DC
  Administered 2016-01-18: 09:00:00 via INTRAVENOUS

## 2016-01-18 MED ORDER — FENTANYL CITRATE (PF) 100 MCG/2ML IJ SOLN
INTRAMUSCULAR | Status: AC | PRN
  Administered 2016-01-18 (×2): 50 ug via INTRAVENOUS

## 2016-01-18 MED ORDER — MIDAZOLAM HCL 2 MG/2ML IJ SOLN
INTRAMUSCULAR | Status: AC | PRN
  Administered 2016-01-18 (×2): 1 mg via INTRAVENOUS

## 2016-01-18 NOTE — H&P (Signed)
Chief Complaint: Patient was seen in consultation today for No chief complaint on file.  at the request of Kasa,Kurian  Referring Physician(s): Kasa,Kurian  Patient Status: [facility] - Out-pt  History of Present Illness: James Faulkner. is a 75 y.o. male smoker with a recently diagnosed hypermetabolic RLL pulmonary mass.  The mass was found while undergoing workup for right neck pain.  He has a chronic cough productive of yellowish sputum.  Denies fever, chills.  Evaluated for endobronchial biopsy and felt to be too high risk.   Past Medical History:  Diagnosis Date  . Arthritis   . BPH (benign prostatic hyperplasia)   . COPD (chronic obstructive pulmonary disease) ([place])   . Cough    chronic  . GERD (gastroesophageal reflux disease)    h/o bleeding ulcer  . Hypertension     Past Surgical History:  Procedure Laterality Date  . APPENDECTOMY    . CATARACT EXTRACTION W/PHACO Right 03/06/2015   Procedure: CATARACT EXTRACTION PHACO AND INTRAOCULAR LENS PLACEMENT ([place]);  Surgeon: Estill Cotta, MD;  Location: [facility] ORS;  Service: Ophthalmology;  Laterality: Right;  [place] 02:02 AP% 26.6 CDE 64.53 fluid pack lot # [id number]  . EYE SURGERY    . TONSILLECTOMY      Allergies: Sulfa antibiotics  Medications: Prior to Admission medications   Medication Sig Start Date End Date Taking? Authorizing Provider  amLODipine (NORVASC) 5 MG tablet Take 1 tablet by mouth daily. 01/12/16  Yes Historical Provider, MD  fluticasone furoate-vilanterol (BREO ELLIPTA) 200-25 MCG/INH AEPB Inhale 1 puff into the lungs daily.   Yes Historical Provider, MD  gabapentin (NEURONTIN) 100 MG capsule Take 100 mg by mouth 3 (three) times daily.   Yes Historical Provider, MD  pantoprazole (PROTONIX) 40 MG tablet Take 40 mg by mouth daily.   Yes Historical Provider, MD  umeclidinium bromide (INCRUSE ELLIPTA) 62.5 MCG/INH AEPB Inhale 1 puff into the lungs daily.   Yes Historical Provider, MD  fluticasone  furoate-vilanterol (BREO ELLIPTA) 200-25 MCG/INH AEPB Inhale 1 puff into the lungs daily. 01/17/16   Flora Lipps, MD  umeclidinium bromide (INCRUSE ELLIPTA) 62.5 MCG/INH AEPB Inhale 1 puff into the lungs daily. 01/17/16   Flora Lipps, MD     History reviewed. No pertinent family history.  Social History   Social History  . Marital status: Married    Spouse name: N/A  . Number of children: N/A  . Years of education: N/A   Social History Main Topics  . Smoking status: Current Every Day Smoker    Packs/day: 1.00    Years: 60.00  . Smokeless tobacco: Current User    Types: Chew  . Alcohol use Yes  . Drug use: No  . Sexual activity: Not Asked   Other Topics Concern  . None   Social History Narrative  . None    ECOG Status: 1 - Symptomatic but completely ambulatory  Review of Systems: A 12 point ROS discussed and pertinent positives are indicated in the HPI above.  All other systems are negative.  Review of Systems  Vital Signs: BP (!) 143/73   Pulse 79   Temp 99.5 F (37.5 C) (Oral)   Resp 16   SpO2 94%   Physical Exam  Constitutional: He is oriented to person, place, and time. He appears well-developed and well-nourished. No distress.  HENT:  Head: Normocephalic and atraumatic.  Eyes: No scleral icterus.  Cardiovascular: Normal rate and regular rhythm.   Pulmonary/Chest: Effort normal. He has decreased breath  sounds in the right middle field and the right lower field.  Neurological: He is alert and oriented to person, place, and time.  Skin: Skin is warm and dry.  Psychiatric: He has a normal mood and affect. His behavior is normal.  Nursing note and vitals reviewed.   Mallampati Score:  1  Imaging: Ct Chest W Contrast  Result Date: 01/01/2016 CLINICAL DATA:  Cough and shortness of breath. Sixty pack-year history. Current smoker. EXAM: CT CHEST WITH CONTRAST TECHNIQUE: Multidetector CT imaging of the chest was performed during intravenous contrast  administration. CONTRAST:  72m ISOVUE-300 IOPAMIDOL (ISOVUE-300) INJECTION 61% COMPARISON:  None FINDINGS: Cardiovascular: The heart size appears normal. No pericardial effusion. Aortic atherosclerosis is identified. Calcification within the RCA, LAD coronary arteries identified. Mediastinum/Nodes: The trachea appears patent and is midline. The esophagus appears within normal limits. The sub- carinal lymph node is enlarged measuring 1.8 cm, image 86 of series 2. Left paratracheal lymph node measures 8 mm, image 63 of series 2. Enlarged right hilar lymph nodes identified. Index node measures 2.7 cm, image 93 of series 2. Lungs/Pleura: Advanced changes of centrilobular and paraseptal emphysema. There is a a small to moderate right pleural effusion. Mass within the right lower lobe appears centrally necrotic measuring 8.3 x 7.8 x 6.5 cm. Interlobular septal thickening within the surrounding right lower lobe is identified and worrisome for lymphangitic spread of tumor. There are several nodules identified elsewhere within the right lung as well as within the left lung. Index nodule within the left lower lobe measures 6 mm, image 92 of series 3. Also in the left lower lobe is a nodule measuring 9 mm, image 121 of series 3. Upper Abdomen: No acute abnormality. Aortic atherosclerosis. The infrarenal abdominal aorta measures at least 3.4 cm in AP dimension, image 174 of series 2. Musculoskeletal: No chest wall abnormality. No acute or significant osseous findings.Spondylosis noted within the thoracic spine. IMPRESSION: 1. Right lower lobe lung mass is identified and highly suspicious for primary bronchogenic carcinoma. Assuming non-small cell histology findings compatible with a T4N2M1a lesion. Further investigation with PET-CT is advised. 2. Emphysema 3. Aortic atherosclerosis and multi vessel coronary artery calcification. 4. Infrarenal abdominal aortic aneurysm. Recommend followup by ultrasound in 3 years. This  recommendation follows ACR consensus guidelines: White Paper of the ACR Incidental Findings Committee II on Vascular Findings. J Am Coll Radiol 2013; 135:361-4435. These results will be called to the ordering clinician or representative by the Radiologist Assistant, and communication documented in the PACS or zVision Dashboard. Electronically Signed   By: TKerby MoorsM.D.   On: 01/01/2016 10:04   Nm Pet Image Initial (pi) Skull Base To Thigh  Result Date: 01/05/2016 CLINICAL DATA:  Initial treatment strategy for right lower lobe lung mass. EXAM: NUCLEAR MEDICINE PET SKULL BASE TO THIGH TECHNIQUE: 11.8 mCi F-18 FDG was injected intravenously. Full-ring PET imaging was performed from the skull base to thigh after the radiotracer. CT data was obtained and used for attenuation correction and anatomic localization. FASTING BLOOD GLUCOSE:  Value: 105 mg/dl COMPARISON:  CT chest 01/01/2016. FINDINGS: NECK No hypermetabolic lymph nodes in the neck. CHEST Necrotic right lower lobe lung mass is markedly hypermetabolic with SUV max = 215.4 18 mm subcarinal lymph node measured on the previous diagnostic CT is hypermetabolic with SUV max = 100.8 2.7 cm short axis right hilar lymph node shows hypermetabolism with SUV max = 14.5. 8 mm AP window/ left paratracheal lymph node measured on the prior study shows only  low level hypermetabolism with SUV max = 2.7. No detectable hypermetabolism in the left lower lobe pulmonary nodules although size of these nodules is below the accepted threshold for reliable resolution by PET imaging. Small right pleural effusion not substantially changed in the interval. ABDOMEN/PELVIS No abnormal hypermetabolic activity within the liver, pancreas, adrenal glands, or spleen. No hypermetabolic lymph nodes in the abdomen or pelvis. 5 cm infrarenal abdominal aortic aneurysm noted. Image 164 series 3 shows a displaced curvilinear calcification which could be related to chronic dissection although  calcification of chronic mural thrombus would also be a consideration. Nonobstructing stones identified lower pole left kidney. SKELETON No focal hypermetabolic activity to suggest skeletal metastasis. IMPRESSION: 1. Markedly hypermetabolic centrally necrotic right lower lobe lung mass consistent with malignancy. Hypermetabolic metastatic disease is seen in the right hilum and subcarinal station. 2. No evidence for hypermetabolism in the AP window lymph node and left lower lobe pulmonary nodules, although these lesions are below the accepted size threshold for reliable resolution on PET imaging. 3. 5.0 cm infrarenal abdominal aortic aneurys Recommend followup by abdomen and pelvis CTA in 3-6 months, and vascular surgery referral/consultation if not already obtained. This recommendation follows ACR consensus guidelines: White Paper of the ACR Incidental Findings Committee II on Vascular Findings. Natasha Mead Coll Radiol 2013; 53:967-289. m Electronically Signed   By: Misty Stanley M.D.   On: 01/05/2016 10:56    Labs:  CBC:  Recent Labs  01/18/16 0738  WBC 11.7*  HGB 12.3*  HCT 36.2*  PLT 336    COAGS:  Recent Labs  01/18/16 0738  INR 0.97  APTT 36    BMP:  Recent Labs  12/04/15 0817  CREATININE 1.00    LIVER FUNCTION TESTS: No results for input(s): BILITOT, AST, ALT, ALKPHOS, PROT, ALBUMIN in the last 8760 hours.  TUMOR MARKERS: No results for input(s): AFPTM, CEA, CA199, CHROMGRNA in the last 8760 hours.  Assessment and Plan:  75 yo male with hypermetabolic RLL pulmonary mass suspicious for bronchogenic carcinoma.  Tissue is required for diagnosis.  The mass is centrally necrotic, biopsy should be performed of the rim of the lesion under CT guidance.  Risks (pneumothorax, hemoptysis, requirement for chest tube placement, death), benefits and alternatives were discussed in detail with patient and his wife.  They understand and wish to proceed.    1.) CT guided lung biopsy.    Thank you for this interesting consult.  I greatly enjoyed meeting James Faulkner. and look forward to participating in their care.  A copy of this report was sent to the requesting provider on this date.  Electronically Signed: Jacqulynn Cadet 01/18/2016, 9:47 AM   I spent a total of  15 Minutes  in face to face in clinical consultation, greater than 50% of which was counseling/coordinating care for RLL pulmonary mass.

## 2016-01-18 NOTE — Discharge Instructions (Signed)

## 2016-01-18 NOTE — Procedures (Signed)
Interventional Radiology Procedure Note  Procedure: CT guided biopsy of RLL pulmonary mass.  Complications: No immediate Recommendations: - Bedrest until CXR cleared.  Minimize talking, coughing or otherwise straining.  - Follow up 2 hr CXR pending   Signed,  Anatalia Kronk K. Emiliano Welshans, MD  

## 2016-01-23 ENCOUNTER — Encounter (INDEPENDENT_AMBULATORY_CARE_PROVIDER_SITE_OTHER): Payer: Self-pay | Admitting: Vascular Surgery

## 2016-01-23 ENCOUNTER — Ambulatory Visit (INDEPENDENT_AMBULATORY_CARE_PROVIDER_SITE_OTHER): Payer: Medicare Other | Admitting: Vascular Surgery

## 2016-01-23 VITALS — BP 161/75 | HR 93 | Resp 16 | Ht 68.0 in | Wt 155.0 lb

## 2016-01-23 DIAGNOSIS — F17210 Nicotine dependence, cigarettes, uncomplicated: Secondary | ICD-10-CM

## 2016-01-23 DIAGNOSIS — C3431 Malignant neoplasm of lower lobe, right bronchus or lung: Secondary | ICD-10-CM | POA: Insufficient documentation

## 2016-01-23 DIAGNOSIS — I714 Abdominal aortic aneurysm, without rupture, unspecified: Secondary | ICD-10-CM | POA: Insufficient documentation

## 2016-01-23 DIAGNOSIS — F17200 Nicotine dependence, unspecified, uncomplicated: Secondary | ICD-10-CM | POA: Insufficient documentation

## 2016-01-23 DIAGNOSIS — C3491 Malignant neoplasm of unspecified part of right bronchus or lung: Secondary | ICD-10-CM | POA: Diagnosis not present

## 2016-01-23 NOTE — Assessment & Plan Note (Signed)
We had a discussion for approximately 4-5 minutes regarding the absolute need for smoking cessation due to the deleterious nature of tobacco on the vascular system and its propensity to make aneurysms grow. We discussed the tobacco use would diminish patency of any intervention, and likely significantly worsen progressio of disease. We discussed multiple agents for quitting including replacement therapy or medications to reduce cravings such as Chantix. The patient voices their understanding of the importance of smoking cessation.

## 2016-01-23 NOTE — Patient Instructions (Signed)
Abdominal Aortic Aneurysm An aneurysm is a weakened or damaged part of an artery wall that bulges from the normal force of blood pumping through the body. An abdominal aortic aneurysm is an aneurysm that occurs in the lower part of the aorta, the main artery of the body.  The major concern with an abdominal aortic aneurysm is that it can enlarge and burst (rupture) or blood can flow between the layers of the wall of the aorta through a tear (aorticdissection). Both of these conditions can cause bleeding inside the body and can be life threatening unless diagnosed and treated promptly. CAUSES  The exact cause of an abdominal aortic aneurysm is unknown. Some contributing factors are:   A hardening of the arteries caused by the buildup of fat and other substances in the lining of a blood vessel (arteriosclerosis).  Inflammation of the walls of an artery (arteritis).   Connective tissue diseases, such as Marfan syndrome.   Abdominal trauma.   An infection, such as syphilis or staphylococcus, in the wall of the aorta (infectious aortitis) caused by bacteria. RISK FACTORS  Risk factors that contribute to an abdominal aortic aneurysm may include:  Age older than 60 years.   High blood pressure (hypertension).  Male gender.  Ethnicity (white race).  Obesity.  Family history of aneurysm (first degree relatives only).  Tobacco use. PREVENTION  The following healthy lifestyle habits may help decrease your risk of abdominal aortic aneurysm:  Quitting smoking. Smoking can raise your blood pressure and cause arteriosclerosis.  Limiting or avoiding alcohol.  Keeping your blood pressure, blood sugar level, and cholesterol levels within normal limits.  Decreasing your salt intake. In somepeople, too much salt can raise blood pressure and increase your risk of abdominal aortic aneurysm.  Eating a diet low in saturated fats and cholesterol.  Increasing your fiber intake by including  whole grains, vegetables, and fruits in your diet. Eating these foods may help lower blood pressure.  Maintaining a healthy weight.  Staying physically active and exercising regularly. SYMPTOMS  The symptoms of abdominal aortic aneurysm may vary depending on the size and rate of growth of the aneurysm.Most grow slowly and do not have any symptoms. When symptoms do occur, they may include:  Pain (abdomen, side, lower back, or groin). The pain may vary in intensity. A sudden onset of severe pain may indicate that the aneurysm has ruptured.  Feeling full after eating only small amounts of food.  Nausea or vomiting or both.  Feeling a pulsating lump in the abdomen.  Feeling faint or passing out. DIAGNOSIS  Since most unruptured abdominal aortic aneurysms have no symptoms, they are often discovered during diagnostic exams for other conditions. An aneurysm may be found during the following procedures:  Ultrasonography (A one-time screening for abdominal aortic aneurysm by ultrasonography is also recommended for all men aged 65-75 years who have ever smoked).  X-ray exams.  A computed tomography (CT).  Magnetic resonance imaging (MRI).  Angiography or arteriography. TREATMENT  Treatment of an abdominal aortic aneurysm depends on the size of your aneurysm, your age, and risk factors for rupture. Medication to control blood pressure and pain may be used to manage aneurysms smaller than 6 cm. Regular monitoring for enlargement may be recommended by your caregiver if:  The aneurysm is 3-4 cm in size (an annual ultrasonography may be recommended).  The aneurysm is 4-4.5 cm in size (an ultrasonography every 6 months may be recommended).  The aneurysm is larger than 4.5 cm in   size (your caregiver may ask that you be examined by a vascular surgeon). If your aneurysm is larger than 6 cm, surgical repair may be recommended. There are two main methods for repair of an aneurysm:   Endovascular  repair (a minimally invasive surgery). This is done most often.  Open repair. This method is used if an endovascular repair is not possible.   This information is not intended to replace advice given to you by your health care provider. Make sure you discuss any questions you have with your health care provider.   Document Released: 12/12/2004 Document Revised: 06/29/2012 Document Reviewed: 04/03/2012 Elsevier Interactive Patient Education 2016 [employer]  

## 2016-01-23 NOTE — Assessment & Plan Note (Signed)
Head his biopsy and goes back to see his oncologist Thursday. Sounds like this is a large tumor with distance breads and the prognosis may be pretty poor.

## 2016-01-23 NOTE — Progress Notes (Signed)
Patient ID: James Faulkner., male   DOB: [birth year], 75 y.o.   MRN: [id number]  Chief Complaint  Patient presents with  . New Patient (Initial Visit)    HPI James Faulkner. is a 75 y.o. male.  I am asked to see the patient by Dr. Pryor Ochoa for evaluation of an AAA.  The patient reports This aneurysm to have been found on a CT scan for his lung cancer which was reasonably recently diagnosed. I have independently reviewed the CT scan. This demonstrates an approximately 3.4 cm infrarenal abdominal aortic aneurysm, but the aorta is incompletely evaluated and there is no assessment of the iliac arteries or distal aorta. The lung cancer is large and appears to have distant spread so his prognosis is reasonably poor. The patient denies aneurysm related symptoms. Specifically, the patient denies new back or abdominal pain, or signs of peripheral embolization    Past Medical History:  Diagnosis Date  . Arthritis   . BPH (benign prostatic hyperplasia)   . COPD (chronic obstructive pulmonary disease) ([place])   . Cough    chronic  . GERD (gastroesophageal reflux disease)    h/o bleeding ulcer  . Hypertension     Past Surgical History:  Procedure Laterality Date  . APPENDECTOMY    . CATARACT EXTRACTION W/PHACO Right 03/06/2015   Procedure: CATARACT EXTRACTION PHACO AND INTRAOCULAR LENS PLACEMENT ([place]);  Surgeon: Estill Cotta, MD;  Location: [facility] ORS;  Service: Ophthalmology;  Laterality: Right;  [place] 02:02 AP% 26.6 CDE 64.53 fluid pack lot # [id number]  . EYE SURGERY    . TONSILLECTOMY      No family history on file. No history of bleeding disorders, clotting disorders, autoimmune diseases or aneurysms  Social History Social History  Substance Use Topics  . Smoking status: Current Every Day Smoker    Packs/day: 1.00    Years: 60.00  . Smokeless tobacco: Current User    Types: Chew  . Alcohol use Yes   No IVDU  Allergies  Allergen Reactions  . Sulfa Antibiotics     Current  Outpatient Prescriptions  Medication Sig Dispense Refill  . amLODipine (NORVASC) 5 MG tablet Take 1 tablet by mouth daily.    . fluticasone furoate-vilanterol (BREO ELLIPTA) 200-25 MCG/INH AEPB Inhale 1 puff into the lungs daily.    . fluticasone furoate-vilanterol (BREO ELLIPTA) 200-25 MCG/INH AEPB Inhale 1 puff into the lungs daily. 60 each 5  . gabapentin (NEURONTIN) 100 MG capsule Take 100 mg by mouth 3 (three) times daily.    . pantoprazole (PROTONIX) 40 MG tablet Take 40 mg by mouth daily.    Marland Kitchen umeclidinium bromide (INCRUSE ELLIPTA) 62.5 MCG/INH AEPB Inhale 1 puff into the lungs daily.    Marland Kitchen umeclidinium bromide (INCRUSE ELLIPTA) 62.5 MCG/INH AEPB Inhale 1 puff into the lungs daily. 30 each 5   No current facility-administered medications for this visit.       REVIEW OF SYSTEMS (Negative unless checked)  Constitutional: '[]'$ Weight loss  '[]'$ Fever  '[]'$ Chills Cardiac: '[]'$ Chest pain   '[]'$ Chest pressure   '[]'$ Palpitations   '[]'$ Shortness of breath when laying flat   '[]'$ Shortness of breath at rest   '[x]'$ Shortness of breath with exertion. Vascular:  '[]'$ Pain in legs with walking   '[]'$ Pain in legs at rest   '[]'$ Pain in legs when laying flat   '[]'$ Claudication   '[]'$ Pain in feet when walking  '[]'$ Pain in feet at rest  '[]'$ Pain in feet when laying flat   '[]'$ History of DVT   '[]'$   Phlebitis   '[]'$ Swelling in legs   '[]'$ Varicose veins   '[]'$ Non-healing ulcers Pulmonary:   '[]'$ Uses home oxygen   '[x]'$ Productive cough   '[]'$ Hemoptysis   '[]'$ Wheeze  '[x]'$ COPD   '[]'$ Asthma Neurologic:  '[]'$ Dizziness  '[]'$ Blackouts   '[]'$ Seizures   '[]'$ History of stroke   '[]'$ History of TIA  '[]'$ Aphasia   '[]'$ Temporary blindness   '[]'$ Dysphagia   '[]'$ Weakness or numbness in arms   '[]'$ Weakness or numbness in legs Musculoskeletal:  '[]'$ Arthritis   '[]'$ Joint swelling   '[]'$ Joint pain   '[]'$ Low back pain Hematologic:  '[]'$ Easy bruising  '[]'$ Easy bleeding   '[]'$ Hypercoagulable state   '[]'$ Anemic  '[]'$ Hepatitis Gastrointestinal:  '[]'$ Blood in stool   '[]'$ Vomiting blood  '[]'$ Gastroesophageal reflux/heartburn    '[]'$ Abdominal pain Genitourinary:  '[]'$ Chronic kidney disease   '[]'$ Difficult urination  '[]'$ Frequent urination  '[]'$ Burning with urination   '[]'$ Hematuria Skin:  '[]'$ Rashes   '[]'$ Ulcers   '[]'$ Wounds Psychological:  '[]'$ History of anxiety   '[]'$  History of major depression.    Physical Exam BP (!) 161/75   Pulse 93   Resp 16   Ht '5\' 8"'$  (1.727 m)   Wt 70.3 kg (155 lb)   BMI 23.57 kg/m  Gen:  WD/WN, NAD Head: /AT, No temporalis wasting. Prominent temp pulse not noted. Ear/Nose/Throat: Hearing grossly intact, nares w/o erythema or drainage, oropharynx w/o Erythema/Exudate Eyes: Conjunctiva clear, sclera non-icteric  Neck: trachea midline.  No bruit or JVD.  Pulmonary:  Good air movement, rhonchi bilaterally Cardiac: RRR, normal S1, S2, no Murmurs, rubs or gallops. Vascular:  Vessel Right Left  Radial Palpable Palpable  Ulnar Palpable Palpable  Brachial Palpable Palpable  Carotid Palpable, without bruit Palpable, without bruit  Aorta Slightly enlarged N/A  Femoral Palpable Palpable  Popliteal Palpable Palpable  PT Palpable Palpable  DP Not Palpable Palpable   Gastrointestinal: soft, non-tender/non-distended. No guarding/reflex. No masses, surgical incisions, or scars. Musculoskeletal: M/S 5/5 throughout.  Extremities without ischemic changes.  No deformity or atrophy. No edema. Neurologic: Sensation grossly intact in extremities.  Symmetrical.  Speech is fluent. Motor exam as listed above. Psychiatric: Judgment intact, Mood & affect appropriate for pt's clinical situation. Dermatologic: No rashes or ulcers noted.  No cellulitis or open wounds. Lymph : No Cervical, Axillary, or Inguinal lymphadenopathy.   Radiology Dg Chest 1 View  Result Date: 01/18/2016 CLINICAL DATA:  Status post lung biopsy. EXAM: CHEST 1 VIEW COMPARISON:  01/01/2016 FINDINGS: Normal heart size. There is no left pleural effusion. The left lung is clear. Right pleural effusion and masslike consolidation of the right lower lobe  is again noted. No pneumothorax visible status post percutaneous biopsy of right lung mass. IMPRESSION: 1. No complication status post right lung biopsy. Electronically Signed   By: Kerby Moors M.D.   On: 01/18/2016 12:04   Ct Chest W Contrast  Result Date: 01/01/2016 CLINICAL DATA:  Cough and shortness of breath. Sixty pack-year history. Current smoker. EXAM: CT CHEST WITH CONTRAST TECHNIQUE: Multidetector CT imaging of the chest was performed during intravenous contrast administration. CONTRAST:  68m ISOVUE-300 IOPAMIDOL (ISOVUE-300) INJECTION 61% COMPARISON:  None FINDINGS: Cardiovascular: The heart size appears normal. No pericardial effusion. Aortic atherosclerosis is identified. Calcification within the RCA, LAD coronary arteries identified. Mediastinum/Nodes: The trachea appears patent and is midline. The esophagus appears within normal limits. The sub- carinal lymph node is enlarged measuring 1.8 cm, image 86 of series 2. Left paratracheal lymph node measures 8 mm, image 63 of series 2. Enlarged right hilar lymph nodes identified. Index node measures 2.7 cm, image 93  of series 2. Lungs/Pleura: Advanced changes of centrilobular and paraseptal emphysema. There is a a small to moderate right pleural effusion. Mass within the right lower lobe appears centrally necrotic measuring 8.3 x 7.8 x 6.5 cm. Interlobular septal thickening within the surrounding right lower lobe is identified and worrisome for lymphangitic spread of tumor. There are several nodules identified elsewhere within the right lung as well as within the left lung. Index nodule within the left lower lobe measures 6 mm, image 92 of series 3. Also in the left lower lobe is a nodule measuring 9 mm, image 121 of series 3. Upper Abdomen: No acute abnormality. Aortic atherosclerosis. The infrarenal abdominal aorta measures at least 3.4 cm in AP dimension, image 174 of series 2. Musculoskeletal: No chest wall abnormality. No acute or significant  osseous findings.Spondylosis noted within the thoracic spine. IMPRESSION: 1. Right lower lobe lung mass is identified and highly suspicious for primary bronchogenic carcinoma. Assuming non-small cell histology findings compatible with a T4N2M1a lesion. Further investigation with PET-CT is advised. 2. Emphysema 3. Aortic atherosclerosis and multi vessel coronary artery calcification. 4. Infrarenal abdominal aortic aneurysm. Recommend followup by ultrasound in 3 years. This recommendation follows ACR consensus guidelines: White Paper of the ACR Incidental Findings Committee II on Vascular Findings. J Am Coll Radiol 2013; 13:086-578 5. These results will be called to the ordering clinician or representative by the Radiologist Assistant, and communication documented in the PACS or zVision Dashboard. Electronically Signed   By: Kerby Moors M.D.   On: 01/01/2016 10:04   Nm Pet Image Initial (pi) Skull Base To Thigh  Result Date: 01/05/2016 CLINICAL DATA:  Initial treatment strategy for right lower lobe lung mass. EXAM: NUCLEAR MEDICINE PET SKULL BASE TO THIGH TECHNIQUE: 11.8 mCi F-18 FDG was injected intravenously. Full-ring PET imaging was performed from the skull base to thigh after the radiotracer. CT data was obtained and used for attenuation correction and anatomic localization. FASTING BLOOD GLUCOSE:  Value: 105 mg/dl COMPARISON:  CT chest 01/01/2016. FINDINGS: NECK No hypermetabolic lymph nodes in the neck. CHEST Necrotic right lower lobe lung mass is markedly hypermetabolic with SUV max = 46.9. 18 mm subcarinal lymph node measured on the previous diagnostic CT is hypermetabolic with SUV max = 62.9. 2.7 cm short axis right hilar lymph node shows hypermetabolism with SUV max = 14.5. 8 mm AP window/ left paratracheal lymph node measured on the prior study shows only low level hypermetabolism with SUV max = 2.7. No detectable hypermetabolism in the left lower lobe pulmonary nodules although size of these  nodules is below the accepted threshold for reliable resolution by PET imaging. Small right pleural effusion not substantially changed in the interval. ABDOMEN/PELVIS No abnormal hypermetabolic activity within the liver, pancreas, adrenal glands, or spleen. No hypermetabolic lymph nodes in the abdomen or pelvis. 5 cm infrarenal abdominal aortic aneurysm noted. Image 164 series 3 shows a displaced curvilinear calcification which could be related to chronic dissection although calcification of chronic mural thrombus would also be a consideration. Nonobstructing stones identified lower pole left kidney. SKELETON No focal hypermetabolic activity to suggest skeletal metastasis. IMPRESSION: 1. Markedly hypermetabolic centrally necrotic right lower lobe lung mass consistent with malignancy. Hypermetabolic metastatic disease is seen in the right hilum and subcarinal station. 2. No evidence for hypermetabolism in the AP window lymph node and left lower lobe pulmonary nodules, although these lesions are below the accepted size threshold for reliable resolution on PET imaging. 3. 5.0 cm infrarenal abdominal aortic aneurys Recommend followup  by abdomen and pelvis CTA in 3-6 months, and vascular surgery referral/consultation if not already obtained. This recommendation follows ACR consensus guidelines: White Paper of the ACR Incidental Findings Committee II on Vascular Findings. Natasha Mead Coll Radiol 2013; 96:283-662. m Electronically Signed   By: Misty Stanley M.D.   On: 01/05/2016 10:56   Ct Biopsy  Result Date: 01/18/2016 INDICATION: 75 year old male with a hypermetabolic right lower lobe pulmonary mass. CT biopsy is warranted to facilitate tissue diagnosis. EXAM: CT-guided biopsy right lower lobe lobe pulmonary nodule Interventional Radiologist:  Criselda Peaches, MD MEDICATIONS: None. ANESTHESIA/SEDATION: Fentanyl 100 mcg IV; Versed 2 mg IV Moderate Sedation Time:  21 minutes The patient was continuously monitored during  the procedure by the interventional radiology nurse under my direct supervision. FLUOROSCOPY TIME:  Fluoroscopy Time: 0 minutes 0 seconds (0 mGy). COMPLICATIONS: None immediate. Estimated blood loss:  0 PROCEDURE: Informed written consent was obtained from the patient after a thorough discussion of the procedural risks, benefits and alternatives. All questions were addressed. Maximal Sterile Barrier Technique was utilized including caps, mask, sterile gowns, sterile gloves, sterile drape, hand hygiene and skin antiseptic. A timeout was performed prior to the initiation of the procedure. A planning axial CT scan was performed. The mass in the right lower lobe was successfully identified. A suitable skin entry site was selected and marked. The region was then sterilely prepped and draped in standard fashion using Betadine skin prep. Local anesthesia was attained by infiltration with 1% lidocaine. A small dermatotomy was made. Under intermittent CT fluoroscopic guidance, a 17 gauge trocar needle was advanced into the lung and positioned at the margin of the nodule. Multiple 18 gauge core biopsies were then coaxially obtained using the BioPince automated biopsy device. Biopsy specimens were placed in formalin and delivered to pathology for further analysis. The biopsy device and introducer needle were removed. Post biopsy axial CT imaging demonstrates no evidence of immediate complication. There is no pneumothorax. Mild perilesional alveolar hemorrhage is not unexpected. The patient tolerated the procedure well. IMPRESSION: Technically successful CT-guided biopsy right lower lobe pulmonary nodule. Signed, Criselda Peaches, MD [facility] Radiology Electronically Signed   By: Jacqulynn Cadet M.D.   On: 01/18/2016 10:09    Labs Recent Results (from the past 2160 hour(s))  I-STAT creatinine     Status: None   Collection Time: 12/04/15  8:17 AM  Result Value Ref  Range   Creatinine, Ser 1.00 0.61 - 1.24 mg/dL  Glucose, capillary     Status: Abnormal   Collection Time: 01/05/16  7:31 AM  Result Value Ref Range   Glucose-Capillary 105 (H) 65 - 99 mg/dL  APTT upon arrival     Status: None   Collection Time: 01/18/16  7:38 AM  Result Value Ref Range   aPTT 36 24 - 36 seconds  CBC upon arrival     Status: Abnormal   Collection Time: 01/18/16  7:38 AM  Result Value Ref Range   WBC 11.7 (H) 3.8 - 10.6 K/uL   RBC 4.05 (L) 4.40 - 5.90 MIL/uL   Hemoglobin 12.3 (L) 13.0 - 18.0 g/dL   HCT 36.2 (L) 40.0 - 52.0 %   MCV 89.4 80.0 - 100.0 fL   MCH 30.4 26.0 - 34.0 pg   MCHC 34.0 32.0 - 36.0 g/dL   RDW 11.8 11.5 - 14.5 %   Platelets 336 150 - 440 K/uL  Protime-INR upon arrival     Status: None  Collection Time: 01/18/16  7:38 AM  Result Value Ref Range   Prothrombin Time 12.9 11.4 - 15.2 seconds   INR 0.97   Surgical pathology     Status: None   Collection Time: 01/18/16 10:00 AM  Result Value Ref Range   SURGICAL PATHOLOGY      Surgical Pathology CASE: [id number] PATIENT: Fahd Buchholz Surgical Pathology Report     SPECIMEN SUBMITTED: A. Lung, right lower lobe, biopsy  CLINICAL HISTORY: PET+  RLL mass, smoker  PRE-OPERATIVE DIAGNOSIS: Suspected lung cancer.  POST-OPERATIVE DIAGNOSIS: Mass centrally necrotic, the hypermetabolic rim was targeted for biopsy     DIAGNOSIS: A. LUNG, RIGHT LOWER LOBE; CT-GUIDED CORE BIOPSY: - NON-SMALL CELL CARCINOMA, SEE COMMENT.  Comment: Poorly differentiated non-small cell carcinoma is present, with high mitotic rate and extensive necrosis. There is no histologic glandular differentiation, and there are only rare foci of apparent squamous differentiation. Immunohistochemistry (IHC) was performed for further characterization. Rare groups of tumor cells are positive for p40, in the foci of squamous differentiation. Scattered and isolated tumor cells show weak nuclear staining for TTF-1.  The  histology and IHC results are inconclusive for tumo r classification. If clinically indicated, additional IHC could be performed, to include other markers for adenocarcinoma and squamous cell carcinoma, and markers for neuroendocrine differentiation.  There is adequate tissue for ancillary testing.  The findings were discussed with Dr. Grayland Ormond on 01/22/16 at 3:52 PM.  IHC slides were prepared by [facility] [place], [place], and interpreted by Dr. Dicie Beam. All controls stained appropriately.    GROSS DESCRIPTION: A. Labeled: right lung mass Tissue fragment(s): multiple Size: aggregate, 5.1 x 0.1 x 0.1 cm Description: tan to brown cores and fragments, wrapped in lens paper and a mesh bag  Entirely submitted in 1-3 cassette(s).  Final Diagnosis performed by Bryan Lemma, MD.  Electronically signed 01/22/2016 4:49:22PM    The electronic signature indicates that the named Attending Pathologist has evaluated the specimen  Technical component performed at [facility], [address] [place], [place] [address] Lab: [phone] Dir: Darrick Penna. Evette Doffing, MD  Professional component performed at [facility], [facility], [place], [place], [place] [address] Lab: [phone] Dir: Dellia Nims. Rubinas, MD      Assessment/Plan:  Primary lung cancer, right [facility]) Head his biopsy and goes back to see his oncologist Thursday. Sounds like this is a large tumor with distance breads and the prognosis may be pretty poor.  Tobacco dependence We had a discussion for approximately 4-5 minutes regarding the absolute need for smoking cessation due to the deleterious nature of tobacco on the vascular system and its propensity to make aneurysms grow. We discussed the tobacco use would diminish patency of any intervention, and likely significantly worsen progressio of disease. We discussed multiple agents for quitting including replacement therapy or  medications to reduce cravings such as Chantix. The patient voices their understanding of the importance of smoking cessation.   AAA (abdominal aortic aneurysm) without rupture ([place]) I have independently reviewed the patient's recent CT scan of the chest. Although this incompletely images the abdominal aorta and stops in the mid infrarenal location, there appears to be a 3.4 cm infrarenal abdominal aortic aneurysm. With the incomplete evaluation, I would like to get a further assessment of his abdominal aorta with duplex sometime in the next several months. The patient wants to put this off while he gets his treatment for lung cancer which is reasonable. The radiologist's assessment that this  does not need to be checked for over a year seems pretty worrisome to me as the aorta has not been completely evaluated and I would want to ensure that a larger aneurysm was not present in the iliac arteries or the distal infrarenal aorta. The patient voices his understanding. We discussed the natural history and pathophysiology of aneurysms today.      Leotis Pain 01/23/2016, 9:45 AM   This note was created with Dragon medical transcription system.  Any errors from dictation are unintentional.

## 2016-01-23 NOTE — Assessment & Plan Note (Addendum)
I have independently reviewed the patient's recent CT scan of the chest. Although this incompletely images the abdominal aorta and stops in the mid infrarenal location, there appears to be a 3.4 cm infrarenal abdominal aortic aneurysm. With the incomplete evaluation, I would like to get a further assessment of his abdominal aorta with duplex sometime in the next several months. The patient wants to put this off while he gets his treatment for lung cancer which is reasonable. The radiologist's assessment that this does not need to be checked for three years seems pretty worrisome and dangerous to me as the aorta has not been completely evaluated and I would want to ensure that a larger aneurysm was not present in the iliac arteries or the distal infrarenal aorta. The patient voices his understanding. We discussed the natural history and pathophysiology of aneurysms today.

## 2016-01-25 ENCOUNTER — Encounter (INDEPENDENT_AMBULATORY_CARE_PROVIDER_SITE_OTHER): Payer: Self-pay

## 2016-01-25 ENCOUNTER — Encounter: Payer: Self-pay | Admitting: Radiation Oncology

## 2016-01-25 ENCOUNTER — Inpatient Hospital Stay: Payer: Medicare Other | Attending: Oncology | Admitting: Oncology

## 2016-01-25 ENCOUNTER — Ambulatory Visit: Payer: Self-pay | Admitting: Oncology

## 2016-01-25 ENCOUNTER — Ambulatory Visit
Admission: RE | Admit: 2016-01-25 | Discharge: 2016-01-25 | Disposition: A | Discharge status: Home / Self Care | Payer: Medicare Other | Source: Ambulatory Visit | Attending: Radiation Oncology | Admitting: Radiation Oncology

## 2016-01-25 VITALS — BP 164/79 | HR 94 | Temp 97.9°F | Wt 155.5 lb

## 2016-01-25 DIAGNOSIS — R634 Abnormal weight loss: Secondary | ICD-10-CM | POA: Insufficient documentation

## 2016-01-25 DIAGNOSIS — R630 Anorexia: Secondary | ICD-10-CM | POA: Insufficient documentation

## 2016-01-25 DIAGNOSIS — N400 Enlarged prostate without lower urinary tract symptoms: Secondary | ICD-10-CM

## 2016-01-25 DIAGNOSIS — J449 Chronic obstructive pulmonary disease, unspecified: Secondary | ICD-10-CM | POA: Insufficient documentation

## 2016-01-25 DIAGNOSIS — Z9049 Acquired absence of other specified parts of digestive tract: Secondary | ICD-10-CM | POA: Insufficient documentation

## 2016-01-25 DIAGNOSIS — I10 Essential (primary) hypertension: Secondary | ICD-10-CM | POA: Insufficient documentation

## 2016-01-25 DIAGNOSIS — F329 Major depressive disorder, single episode, unspecified: Secondary | ICD-10-CM | POA: Insufficient documentation

## 2016-01-25 DIAGNOSIS — J90 Pleural effusion, not elsewhere classified: Secondary | ICD-10-CM | POA: Diagnosis not present

## 2016-01-25 DIAGNOSIS — Z79899 Other long term (current) drug therapy: Secondary | ICD-10-CM | POA: Insufficient documentation

## 2016-01-25 DIAGNOSIS — C7931 Secondary malignant neoplasm of brain: Secondary | ICD-10-CM

## 2016-01-25 DIAGNOSIS — F17210 Nicotine dependence, cigarettes, uncomplicated: Secondary | ICD-10-CM | POA: Insufficient documentation

## 2016-01-25 DIAGNOSIS — K219 Gastro-esophageal reflux disease without esophagitis: Secondary | ICD-10-CM | POA: Diagnosis not present

## 2016-01-25 DIAGNOSIS — I2510 Atherosclerotic heart disease of native coronary artery without angina pectoris: Secondary | ICD-10-CM | POA: Diagnosis not present

## 2016-01-25 DIAGNOSIS — R109 Unspecified abdominal pain: Secondary | ICD-10-CM | POA: Insufficient documentation

## 2016-01-25 DIAGNOSIS — D72829 Elevated white blood cell count, unspecified: Secondary | ICD-10-CM | POA: Diagnosis not present

## 2016-01-25 DIAGNOSIS — M129 Arthropathy, unspecified: Secondary | ICD-10-CM | POA: Insufficient documentation

## 2016-01-25 DIAGNOSIS — R042 Hemoptysis: Secondary | ICD-10-CM | POA: Insufficient documentation

## 2016-01-25 DIAGNOSIS — F419 Anxiety disorder, unspecified: Secondary | ICD-10-CM | POA: Insufficient documentation

## 2016-01-25 DIAGNOSIS — R591 Generalized enlarged lymph nodes: Secondary | ICD-10-CM

## 2016-01-25 DIAGNOSIS — C3431 Malignant neoplasm of lower lobe, right bronchus or lung: Secondary | ICD-10-CM | POA: Insufficient documentation

## 2016-01-25 DIAGNOSIS — M542 Cervicalgia: Secondary | ICD-10-CM | POA: Diagnosis not present

## 2016-01-25 DIAGNOSIS — I639 Cerebral infarction, unspecified: Secondary | ICD-10-CM

## 2016-01-25 DIAGNOSIS — I714 Abdominal aortic aneurysm, without rupture: Secondary | ICD-10-CM | POA: Diagnosis not present

## 2016-01-25 DIAGNOSIS — R05 Cough: Secondary | ICD-10-CM | POA: Diagnosis not present

## 2016-01-25 NOTE — Progress Notes (Signed)
[place]  Telephone:([phone] [phone]  ID: James Faulkner. 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  INTERVAL HISTORY: Patient returns to clinic today for further evaluation and discussion of his pathology results. Patient admits has continued depression and anxiety, but otherwise feels well. He has no neurologic complaints. He denies any recent fevers or illnesses. He has a fair appetite, but denies weight loss. He denies any cough, hemoptysis, or shortness of breath. He has no nausea, vomiting, constipation, or diarrhea. He has no urinary complaints. Patient otherwise feels well and offers no further specific complaints.  REVIEW OF SYSTEMS:   Review of Systems  Constitutional: Negative.  Negative for fever, malaise/fatigue and weight loss.  Respiratory: Negative.  Negative for cough, hemoptysis and shortness of breath.   Cardiovascular: Negative.  Negative for chest pain and leg swelling.  Gastrointestinal: Negative.  Negative for abdominal pain.  Genitourinary: Negative.   Musculoskeletal: Negative.   Neurological: Negative.  Negative for sensory change, focal weakness and weakness.  Psychiatric/Behavioral: Positive for depression. Negative for suicidal ideas. The patient is nervous/anxious.    As per HPI. Otherwise, a complete review of systems is negative.   PAST MEDICAL HISTORY: Past Medical History:  Diagnosis Date  . Arthritis   . BPH (benign prostatic hyperplasia)   . COPD (chronic obstructive pulmonary disease) ([place])   . Cough    chronic  . GERD (gastroesophageal reflux disease)    h/o bleeding ulcer  . Hypertension     PAST SURGICAL HISTORY: Past Surgical History:  Procedure Laterality Date  . APPENDECTOMY    . CATARACT EXTRACTION W/PHACO Right 03/06/2015   Procedure:  CATARACT EXTRACTION PHACO AND INTRAOCULAR LENS PLACEMENT ([place]);  Surgeon: Estill Cotta, MD;  Location: [facility] ORS;  Service: Ophthalmology;  Laterality: Right;  [place] 02:02 AP% 26.6 CDE 64.53 fluid pack lot # [id number]  . EYE SURGERY    . TONSILLECTOMY      FAMILY HISTORY: Reviewed and unchanged. No reported history of malignancy or chronic disease.  ADVANCED DIRECTIVES (Y/N):  N  HEALTH MAINTENANCE: Social History  Substance Use Topics  . Smoking status: Current Every Day Smoker    Packs/day: 1.00    Years: 60.00  . Smokeless tobacco: Current User    Types: Chew  . Alcohol use Yes     Colonoscopy:  PAP:  Bone density:  Lipid panel:  Allergies  Allergen Reactions  . Sulfa Antibiotics Swelling and Rash    Current Outpatient Prescriptions  Medication Sig Dispense Refill  . amLODipine (NORVASC) 5 MG tablet Take 5 mg by mouth daily.     Marland Kitchen dexamethasone (DECADRON) 4 MG tablet Take 1 tablet (4 mg total) by mouth 2 (two) times daily with a meal. 60 tablet 0  . fluticasone furoate-vilanterol (BREO ELLIPTA) 200-25 MCG/INH AEPB Inhale 1 puff into the lungs daily. 60 each 5  . gabapentin (NEURONTIN) 100 MG capsule Take 100 mg by mouth 3 (three) times daily.    Marland Kitchen LUMIGAN 0.01 % SOLN Place 1 drop into both eyes at bedtime.    . pantoprazole (PROTONIX) 40 MG tablet Take 40 mg by mouth daily.    . sertraline (ZOLOFT) 25 MG tablet Take 25 mg by mouth daily.    Marland Kitchen umeclidinium bromide (INCRUSE ELLIPTA) 62.5 MCG/INH AEPB Inhale 1 puff into the lungs daily. 30 each 5  No current facility-administered medications for this visit.     OBJECTIVE: There were no vitals filed for this visit.   There is no height or weight on file to calculate BMI.    ECOG FS:0 - Asymptomatic  General: Well-developed, well-nourished, no acute distress. Eyes: Pink conjunctiva, anicteric sclera. Lungs: Clear to auscultation bilaterally. Heart: Regular rate and rhythm. No rubs, murmurs, or gallops. Abdomen:  Soft, nontender, nondistended. No organomegaly noted, normoactive bowel sounds. Musculoskeletal: No edema, cyanosis, or clubbing. Neuro: Alert, answering all questions appropriately. Cranial nerves grossly intact. Skin: No rashes or petechiae noted. Psych: Flat affect.   LAB RESULTS:  Lab Results  Component Value Date   NA 144 04/25/2012   K 3.5 04/25/2012   CL 113 (H) 04/25/2012   CO2 21 04/25/2012   GLUCOSE 84 04/25/2012   BUN 13 04/25/2012   CREATININE 1.00 01/26/2016   CALCIUM 7.4 (L) 04/25/2012   PROT 5.3 (L) 04/22/2012   ALBUMIN 2.8 (L) 04/22/2012   AST 20 04/22/2012   ALT 19 04/22/2012   ALKPHOS 56 04/22/2012   BILITOT 0.4 04/22/2012   GFRNONAA >60 04/25/2012   GFRAA >60 04/25/2012    Lab Results  Component Value Date   WBC 11.7 (H) 01/18/2016   NEUTROABS 8.1 (H) 04/25/2012   HGB 12.3 (L) 01/18/2016   HCT 36.2 (L) 01/18/2016   MCV 89.4 01/18/2016   PLT 336 01/18/2016     STUDIES: Dg Chest 1 View  Result Date: 01/18/2016 CLINICAL DATA:  Status post lung biopsy. EXAM: CHEST 1 VIEW COMPARISON:  01/01/2016 FINDINGS: Normal heart size. There is no left pleural effusion. The left lung is clear. Right pleural effusion and masslike consolidation of the right lower lobe is again noted. No pneumothorax visible status post percutaneous biopsy of right lung mass. IMPRESSION: 1. No complication status post right lung biopsy. Electronically Signed   By: Kerby Moors M.D.   On: 01/18/2016 12:04   Ct Chest W Contrast  Result Date: 01/01/2016 CLINICAL DATA:  Cough and shortness of breath. Sixty pack-year history. Current smoker. EXAM: CT CHEST WITH CONTRAST TECHNIQUE: Multidetector CT imaging of the chest was performed during intravenous contrast administration. CONTRAST:  3m ISOVUE-300 IOPAMIDOL (ISOVUE-300) INJECTION 61% COMPARISON:  None FINDINGS: Cardiovascular: The heart size appears normal. No pericardial effusion. Aortic atherosclerosis is identified. Calcification  within the RCA, LAD coronary arteries identified. Mediastinum/Nodes: The trachea appears patent and is midline. The esophagus appears within normal limits. The sub- carinal lymph node is enlarged measuring 1.8 cm, image 86 of series 2. Left paratracheal lymph node measures 8 mm, image 63 of series 2. Enlarged right hilar lymph nodes identified. Index node measures 2.7 cm, image 93 of series 2. Lungs/Pleura: Advanced changes of centrilobular and paraseptal emphysema. There is a a small to moderate right pleural effusion. Mass within the right lower lobe appears centrally necrotic measuring 8.3 x 7.8 x 6.5 cm. Interlobular septal thickening within the surrounding right lower lobe is identified and worrisome for lymphangitic spread of tumor. There are several nodules identified elsewhere within the right lung as well as within the left lung. Index nodule within the left lower lobe measures 6 mm, image 92 of series 3. Also in the left lower lobe is a nodule measuring 9 mm, image 121 of series 3. Upper Abdomen: No acute abnormality. Aortic atherosclerosis. The infrarenal abdominal aorta measures at least 3.4 cm in AP dimension, image 174 of series 2. Musculoskeletal: No chest wall abnormality. No acute or significant osseous findings.Spondylosis noted  within the thoracic spine. IMPRESSION: 1. Right lower lobe lung mass is identified and highly suspicious for primary bronchogenic carcinoma. Assuming non-small cell histology findings compatible with a T4N2M1a lesion. Further investigation with PET-CT is advised. 2. Emphysema 3. Aortic atherosclerosis and multi vessel coronary artery calcification. 4. Infrarenal abdominal aortic aneurysm. Recommend followup by ultrasound in 3 years. This recommendation follows ACR consensus guidelines: White Paper of the ACR Incidental Findings Committee II on Vascular Findings. J Am Coll Radiol 2013; 49:702-637 5. These results will be called to the ordering clinician or representative by  the Radiologist Assistant, and communication documented in the PACS or zVision Dashboard. Electronically Signed   By: Kerby Moors M.D.   On: 01/01/2016 10:04   Mr Jeri Cos CH Contrast  Result Date: 01/26/2016 CLINICAL DATA:  Right lower lobe lung cancer.  Initial staging. EXAM: MRI HEAD WITHOUT AND WITH CONTRAST TECHNIQUE: Multiplanar, multiecho pulse sequences of the brain and surrounding structures were obtained without and with intravenous contrast. CONTRAST:  68m MULTIHANCE GADOBENATE DIMEGLUMINE 529 MG/ML IV SOLN COMPARISON:  None. FINDINGS: Brain: There is no evidence of acute infarct, intracranial hemorrhage, midline shift, or extra-axial fluid collection. Ventricular enlargement is favored to reflect moderate, central predominant cerebral atrophy rather than hydrocephalus. T2 hyperintensities in the subcortical and deep cerebral white matter and pons are nonspecific but compatible with mild-to-moderate chronic small vessel ischemic disease. A chronic lacunar infarct is present in the right basal ganglia/ corona radiata. There is a 7 mm enhancing lesion in the posterior right frontal lobe white matter at the level of the centrum semiovale (series 10, image 43) with minimal surrounding edema. There is a 2 mm enhancing lesion in the periventricular white matter of the right temporal lobe (series 10, image 23 and series 11, image 18). Subependymal enhancing nodules along the margins of the lateral ventricles measures 3 mm in the right parieto-occipital region (series 10, image 31 and series 12, image 7) and 4 mm in the left temporal lobe (series 10, image 24 and series 12, image 21). There is a 4 mm ring-enhancing subcortical lesion in the medial right occipital lobe (series 10, image 33). There is a 4 mm ring-enhancing subcortical lesion in the left frontal lobe (series 10, image 33 and series 11, image 7). Vascular: Abnormal appearance of the non dominant distal left vertebral artery may reflect slow  flow or occlusion. Other major intracranial vascular flow voids are preserved. Skull and upper cervical spine: Unremarkable bone marrow signal. Sinuses/Orbits: Prior bilateral cataract extraction. Mild bilateral ethmoid and left maxillary sinus mucosal thickening. Clear mastoid air cells. Other: None. IMPRESSION: 1. Six subcentimeter enhancing supratentorial brain lesions consistent with metastases. No significant edema. 2. Mild to moderate chronic small vessel ischemic disease and moderate cerebral atrophy. Electronically Signed   By: ALogan BoresM.D.   On: 01/26/2016 09:24   Nm Pet Image Initial (pi) Skull Base To Thigh  Result Date: 01/05/2016 CLINICAL DATA:  Initial treatment strategy for right lower lobe lung mass. EXAM: NUCLEAR MEDICINE PET SKULL BASE TO THIGH TECHNIQUE: 11.8 mCi F-18 FDG was injected intravenously. Full-ring PET imaging was performed from the skull base to thigh after the radiotracer. CT data was obtained and used for attenuation correction and anatomic localization. FASTING BLOOD GLUCOSE:  Value: 105 mg/dl COMPARISON:  CT chest 01/01/2016. FINDINGS: NECK No hypermetabolic lymph nodes in the neck. CHEST Necrotic right lower lobe lung mass is markedly hypermetabolic with SUV max = 288.5 18 mm subcarinal lymph node measured on the previous  diagnostic CT is hypermetabolic with SUV max = 00.9. 2.7 cm short axis right hilar lymph node shows hypermetabolism with SUV max = 14.5. 8 mm AP window/ left paratracheal lymph node measured on the prior study shows only low level hypermetabolism with SUV max = 2.7. No detectable hypermetabolism in the left lower lobe pulmonary nodules although size of these nodules is below the accepted threshold for reliable resolution by PET imaging. Small right pleural effusion not substantially changed in the interval. ABDOMEN/PELVIS No abnormal hypermetabolic activity within the liver, pancreas, adrenal glands, or spleen. No hypermetabolic lymph nodes in the  abdomen or pelvis. 5 cm infrarenal abdominal aortic aneurysm noted. Image 164 series 3 shows a displaced curvilinear calcification which could be related to chronic dissection although calcification of chronic mural thrombus would also be a consideration. Nonobstructing stones identified lower pole left kidney. SKELETON No focal hypermetabolic activity to suggest skeletal metastasis. IMPRESSION: 1. Markedly hypermetabolic centrally necrotic right lower lobe lung mass consistent with malignancy. Hypermetabolic metastatic disease is seen in the right hilum and subcarinal station. 2. No evidence for hypermetabolism in the AP window lymph node and left lower lobe pulmonary nodules, although these lesions are below the accepted size threshold for reliable resolution on PET imaging. 3. 5.0 cm infrarenal abdominal aortic aneurys Recommend followup by abdomen and pelvis CTA in 3-6 months, and vascular surgery referral/consultation if not already obtained. This recommendation follows ACR consensus guidelines: White Paper of the ACR Incidental Findings Committee II on Vascular Findings. Natasha Mead Coll Radiol 2013; 38:182-993. m Electronically Signed   By: Misty Stanley M.D.   On: 01/05/2016 10:56   Ct Biopsy  Result Date: 01/18/2016 INDICATION: 75 year old male with a hypermetabolic right lower lobe pulmonary mass. CT biopsy is warranted to facilitate tissue diagnosis. EXAM: CT-guided biopsy right lower lobe lobe pulmonary nodule Interventional Radiologist:  Criselda Peaches, MD MEDICATIONS: None. ANESTHESIA/SEDATION: Fentanyl 100 mcg IV; Versed 2 mg IV Moderate Sedation Time:  21 minutes The patient was continuously monitored during the procedure by the interventional radiology nurse under my direct supervision. FLUOROSCOPY TIME:  Fluoroscopy Time: 0 minutes 0 seconds (0 mGy). COMPLICATIONS: None immediate. Estimated blood loss:  0 PROCEDURE: Informed written consent was obtained from the patient after a thorough  discussion of the procedural risks, benefits and alternatives. All questions were addressed. Maximal Sterile Barrier Technique was utilized including caps, mask, sterile gowns, sterile gloves, sterile drape, hand hygiene and skin antiseptic. A timeout was performed prior to the initiation of the procedure. A planning axial CT scan was performed. The mass in the right lower lobe was successfully identified. A suitable skin entry site was selected and marked. The region was then sterilely prepped and draped in standard fashion using Betadine skin prep. Local anesthesia was attained by infiltration with 1% lidocaine. A small dermatotomy was made. Under intermittent CT fluoroscopic guidance, a 17 gauge trocar needle was advanced into the lung and positioned at the margin of the nodule. Multiple 18 gauge core biopsies were then coaxially obtained using the BioPince automated biopsy device. Biopsy specimens were placed in formalin and delivered to pathology for further analysis. The biopsy device and introducer needle were removed. Post biopsy axial CT imaging demonstrates no evidence of immediate complication. There is no pneumothorax. Mild perilesional alveolar hemorrhage is not unexpected. The patient tolerated the procedure well. IMPRESSION: Technically successful CT-guided biopsy right lower lobe pulmonary nodule. Signed, Criselda Peaches, MD [facility] Electronically Signed   By: Myrle Sheng  Laurence Ferrari M.D.   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  PLAN:   1.  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 Surgical pathology confirms poorly differentiated non-small cell carcinoma. MRI the brain which was completed one day after clinic visit revealed 6 individual sites of metastatic disease. Patient will return to clinic on February 05, 2016 for further evaluation and treatment planning. Will likely delay any systemic chemotherapy until patient completes his XRT to his brain.  2. Depression: Patient admits to suicidal ideation in the past, but none currently. He was offered antidepressants but has declined at this time. Continue to monitor closely now the patient has known brain metastasis.   Approximately 30 minutes was spent in discussion of which greater than 50% was consultation.  Patient expressed understanding and was in agreement with this plan. He also understands that He can call clinic at any time with any questions, concerns, or complaints.   Cancer of lower lobe of right lung [facility])   Staging form: Lung, AJCC 7th Edition   - Clinical stage from 01/29/2016: Stage IV (T3, N2, M1b) - Signed by Lloyd Huger, MD on 01/29/2016  Lloyd Huger, MD   01/29/2016 2:31 PM

## 2016-01-25 NOTE — Consult Note (Signed)
NEW PATIENT EVALUATION  Name: James Faulkner.  MRN: [id number]  Date:   01/25/2016     DOB: [birth year]   This 75 y.o. male patient presents to the clinic for initial evaluation of non-small cell lung carcinoma of the right lower lobe stage IIIB (T3 N3 M0) for concurrent chemoradiation.  REFERRING PHYSICIAN: [name], MD  CHIEF COMPLAINT:  Chief Complaint  Patient presents with  . Lung Cancer    Initial visit    DIAGNOSIS: The encounter diagnosis was Malignant neoplasm of lower lobe of right lung ([place]).   PREVIOUS INVESTIGATIONS:  PET CT and CT scans reviewed MRI of brain scheduled for tomorrow Pathology report reviewed Clinical notes reviewed  HPI: Patient is a 75 year old male who presented to ENT complaining of right-sided neck pain and flank pain. CT scan of the chest showed a large right lower lobe lung mass. PET CT scan showed significantly hypermetabolic central necrotic mass in the right lower lobe consistent with malignancy. There was also subcarinal hypermetabolic and right hilar metabolic activity. He also was noted to have a 5 cm infrarenal abdominal aortic aneurysm. Patient is quite depressed. He underwent CT-guided core biopsy of his lung mass showing poorly differentiated non-small cell lung cancer. Patient is to see medical oncology today for discussion of concurrent chemoradiation. Patient is quite depressed. He has poor at appetite and has lost significant weight. He specifically denies dysphagia. He's had several episodes of hemoptysis since his biopsy. He is now referred to radiation oncology for opinion.  PLANNED TREATMENT REGIMEN: Concurrent chemoradiation with radiation given in a split course fashion  PAST MEDICAL HISTORY:  has a past medical history of Arthritis; BPH (benign prostatic hyperplasia); COPD (chronic obstructive pulmonary disease) ([place]); Cough; GERD (gastroesophageal reflux disease); and Hypertension.    PAST SURGICAL HISTORY:  Past Surgical  History:  Procedure Laterality Date  . APPENDECTOMY    . CATARACT EXTRACTION W/PHACO Right 03/06/2015   Procedure: CATARACT EXTRACTION PHACO AND INTRAOCULAR LENS PLACEMENT ([place]);  Surgeon: Estill Cotta, MD;  Location: [facility] ORS;  Service: Ophthalmology;  Laterality: Right;  [place] 02:02 AP% 26.6 CDE 64.53 fluid pack lot # [id number]  . EYE SURGERY    . TONSILLECTOMY      FAMILY HISTORY: family history is not on file.  SOCIAL HISTORY:  reports that he has been smoking.  He has a 60.00 pack-year smoking history. His smokeless tobacco use includes Chew. He reports that he drinks alcohol. He reports that he does not use drugs.  ALLERGIES: Sulfa antibiotics  MEDICATIONS:  Current Outpatient Prescriptions  Medication Sig Dispense Refill  . amLODipine (NORVASC) 5 MG tablet Take 1 tablet by mouth daily.    . fluticasone furoate-vilanterol (BREO ELLIPTA) 200-25 MCG/INH AEPB Inhale 1 puff into the lungs daily.    . fluticasone furoate-vilanterol (BREO ELLIPTA) 200-25 MCG/INH AEPB Inhale 1 puff into the lungs daily. 60 each 5  . gabapentin (NEURONTIN) 100 MG capsule Take 100 mg by mouth 3 (three) times daily.    . pantoprazole (PROTONIX) 40 MG tablet Take 40 mg by mouth daily.    Marland Kitchen umeclidinium bromide (INCRUSE ELLIPTA) 62.5 MCG/INH AEPB Inhale 1 puff into the lungs daily.    Marland Kitchen umeclidinium bromide (INCRUSE ELLIPTA) 62.5 MCG/INH AEPB Inhale 1 puff into the lungs daily. 30 each 5   No current facility-administered medications for this encounter.     ECOG PERFORMANCE STATUS:  1 - Symptomatic but completely ambulatory  REVIEW OF SYSTEMS: Except for the poor appetite and depression  Patient  denies any weight loss, fatigue, weakness, fever, chills or night sweats. Patient denies any loss of vision, blurred vision. Patient denies any ringing  of the ears or hearing loss. No irregular heartbeat. Patient denies heart murmur or history of fainting. Patient denies any chest pain or pain radiating to her  upper extremities. Patient denies any shortness of breath, difficulty breathing at night, cough or hemoptysis. Patient denies any swelling in the lower legs. Patient denies any nausea vomiting, vomiting of blood, or coffee ground material in the vomitus. Patient denies any stomach pain. Patient states has had normal bowel movements no significant constipation or diarrhea. Patient denies any dysuria, hematuria or significant nocturia. Patient denies any problems walking, swelling in the joints or loss of balance. Patient denies any skin changes, loss of hair or loss of weight. Patient denies any excessive worrying or anxiety or significant depression. Patient denies any problems with insomnia. Patient denies excessive thirst, polyuria, polydipsia. Patient denies any swollen glands, patient denies easy bruising or easy bleeding. Patient denies any recent infections, allergies or URI. Patient "s visual fields have not changed significantly in recent time.    PHYSICAL EXAM: BP (!) 164/79   Pulse 94   Temp 97.9 F (36.6 C)   Wt 155 lb 8.6 oz (70.5 kg)   BMI 23.65 kg/m  Thin slightly cachectic male in NAD. No cervical or supraclavicular adenopathy is detected. Some mild decreased breath sounds in the right lower lobe. Well-developed well-nourished patient in NAD. HEENT reveals PERLA, EOMI, discs not visualized.  Oral cavity is clear. No oral mucosal lesions are identified. Neck is clear without evidence of cervical or supraclavicular adenopathy. Lungs are clear to A&P. Cardiac examination is essentially unremarkable with regular rate and rhythm without murmur rub or thrill. Abdomen is benign with no organomegaly or masses noted. Motor sensory and DTR levels are equal and symmetric in the upper and lower extremities. Cranial nerves II through XII are grossly intact. Proprioception is intact. No peripheral adenopathy or edema is identified. No motor or sensory levels are noted. Crude visual fields are within  normal range.  LABORATORY DATA: Pathology reports reviewed    RADIOLOGY RESULTS: CT and PET/CT scan reviewed MRI scan to be reviewed after completion tomorrow   IMPRESSION: Stage IIIB non-small cell lung cancer poorly differentiated of the right lower lobe in 75 year old male  PLAN: At this time I would recommend concurrent chemoradiation. I would like to treat this split course fashion based on the large size of the right lower lobe lesion. Would plan on approximate 4000 cGy over 4 weeks and reevaluate after 1 week break for possible small field boost. I would use PET/CT fusion to encompass the entire right lower lobe lesion and its hypermetabolic activity as well as the subcarinal and right hilar hypermetabolic activity of lymph nodes. Risks and benefits of treatment including possible dysphasia from radiation, fatigue alteration of blood counts skin reaction all were discussed in detail with the patient and his wife. Both seem to comprehend my treatment plan well. I first met ordered CT simulation for early next week.  I would like to take this opportunity to thank you for allowing me to participate in the care of your patient.Armstead Peaks., MD

## 2016-01-26 ENCOUNTER — Ambulatory Visit
Admission: RE | Admit: 2016-01-26 | Discharge: 2016-01-26 | Disposition: A | Discharge status: Home / Self Care | Payer: Medicare Other | Source: Ambulatory Visit | Attending: Oncology | Admitting: Oncology

## 2016-01-26 DIAGNOSIS — C3431 Malignant neoplasm of lower lobe, right bronchus or lung: Secondary | ICD-10-CM | POA: Diagnosis not present

## 2016-01-26 DIAGNOSIS — R930 Abnormal findings on diagnostic imaging of skull and head, not elsewhere classified: Secondary | ICD-10-CM | POA: Diagnosis not present

## 2016-01-26 DIAGNOSIS — I6782 Cerebral ischemia: Secondary | ICD-10-CM | POA: Diagnosis not present

## 2016-01-26 DIAGNOSIS — G319 Degenerative disease of nervous system, unspecified: Secondary | ICD-10-CM | POA: Diagnosis not present

## 2016-01-26 DIAGNOSIS — R918 Other nonspecific abnormal finding of lung field: Secondary | ICD-10-CM | POA: Insufficient documentation

## 2016-01-26 LAB — POCT I-STAT CREATININE: CREATININE: 1 mg/dL (ref 0.61–1.24)

## 2016-01-26 MED ORDER — GADOBENATE DIMEGLUMINE 529 MG/ML IV SOLN
15.0000 mL | Freq: Once | INTRAVENOUS | Status: AC | PRN
  Administered 2016-01-26: 14 mL via INTRAVENOUS

## 2016-01-26 NOTE — Patient Instructions (Signed)

## 2016-01-29 ENCOUNTER — Other Ambulatory Visit: Payer: Self-pay | Admitting: *Deleted

## 2016-01-29 ENCOUNTER — Ambulatory Visit
Admission: RE | Admit: 2016-01-29 | Discharge: 2016-01-29 | Disposition: A | Discharge status: Home / Self Care | Payer: Medicare Other | Source: Ambulatory Visit | Attending: Radiation Oncology | Admitting: Radiation Oncology

## 2016-01-29 ENCOUNTER — Ambulatory Visit: Payer: Medicare Other

## 2016-01-29 ENCOUNTER — Other Ambulatory Visit (INDEPENDENT_AMBULATORY_CARE_PROVIDER_SITE_OTHER): Payer: Self-pay | Admitting: Vascular Surgery

## 2016-01-29 DIAGNOSIS — C7931 Secondary malignant neoplasm of brain: Secondary | ICD-10-CM | POA: Diagnosis not present

## 2016-01-29 DIAGNOSIS — N400 Enlarged prostate without lower urinary tract symptoms: Secondary | ICD-10-CM | POA: Diagnosis not present

## 2016-01-29 DIAGNOSIS — M129 Arthropathy, unspecified: Secondary | ICD-10-CM | POA: Diagnosis not present

## 2016-01-29 DIAGNOSIS — R109 Unspecified abdominal pain: Secondary | ICD-10-CM | POA: Diagnosis not present

## 2016-01-29 DIAGNOSIS — Z79899 Other long term (current) drug therapy: Secondary | ICD-10-CM | POA: Diagnosis not present

## 2016-01-29 DIAGNOSIS — M542 Cervicalgia: Secondary | ICD-10-CM | POA: Diagnosis not present

## 2016-01-29 DIAGNOSIS — K219 Gastro-esophageal reflux disease without esophagitis: Secondary | ICD-10-CM | POA: Diagnosis not present

## 2016-01-29 DIAGNOSIS — I10 Essential (primary) hypertension: Secondary | ICD-10-CM | POA: Diagnosis not present

## 2016-01-29 DIAGNOSIS — R042 Hemoptysis: Secondary | ICD-10-CM | POA: Diagnosis not present

## 2016-01-29 DIAGNOSIS — J410 Simple chronic bronchitis: Secondary | ICD-10-CM | POA: Diagnosis not present

## 2016-01-29 DIAGNOSIS — I714 Abdominal aortic aneurysm, without rupture: Secondary | ICD-10-CM | POA: Diagnosis not present

## 2016-01-29 DIAGNOSIS — J449 Chronic obstructive pulmonary disease, unspecified: Secondary | ICD-10-CM | POA: Diagnosis not present

## 2016-01-29 DIAGNOSIS — C3491 Malignant neoplasm of unspecified part of right bronchus or lung: Secondary | ICD-10-CM | POA: Diagnosis not present

## 2016-01-29 DIAGNOSIS — H409 Unspecified glaucoma: Secondary | ICD-10-CM | POA: Diagnosis not present

## 2016-01-29 DIAGNOSIS — C3431 Malignant neoplasm of lower lobe, right bronchus or lung: Secondary | ICD-10-CM | POA: Diagnosis not present

## 2016-01-29 MED ORDER — DEXAMETHASONE 4 MG PO TABS: 4.0000 mg | ORAL_TABLET | Freq: Two times a day (BID) | ORAL | 0 refills | Status: DC

## 2016-01-29 NOTE — Progress Notes (Signed)
Radiation Oncology Follow up Note  Name: James Faulkner.   Date:   01/29/2016 MRN:  [id number] DOB: [birth year]    This 75 y.o. male presents to the clinic today for newly discovered multiple brain metastasis.  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 He completed his metastatic workup last week and on Friday MRI scan showed at least 6 subcentimeter. Enhancing masses consistent with brain metastasis. Patient has been having some headaches no specific focal neurologic complaints no change in visual fields.  COMPLICATIONS OF TREATMENT: none  FOLLOW UP COMPLIANCE: keeps appointments   PHYSICAL EXAM:  There were no vitals taken for this visit. Visual fields within normal range proprioception is intact no focal motor sensory or DTR levels are appreciated. Well-developed well-nourished patient in NAD. HEENT reveals PERLA, EOMI, discs not visualized.  Oral cavity is clear. No oral mucosal lesions are identified. Neck is clear without evidence of cervical or supraclavicular adenopathy. Lungs are clear to A&P. Cardiac examination is essentially unremarkable with regular rate and rhythm without murmur rub or thrill. Abdomen is benign with no organomegaly or masses noted. Motor sensory and DTR levels are equal and symmetric in the upper and lower extremities. Cranial nerves II through XII are grossly intact. Proprioception is intact. No peripheral adenopathy or edema is identified. No motor or sensory levels are noted. Crude visual fields are within normal range.  RADIOLOGY RESULTS: MRI of the brain is reviewed  PLAN: At this time I discussed case with medical oncology. We will hold his concurrent treatment for lung cancer until we completed whole brain radiation. I am planning on delivering 3000 cGy in 10 fractions risks and benefits of  those treatments and including cognitive decline skin reaction hair loss alteration of blood counts all were discussed in detail with the patient and his wife. Consent was signed and we simulated him today. I've also started him on 4 mg of Decadron twice a day. We will try to start history was later this week or first thing next week as planned.  I would like to take this opportunity to thank you for allowing me to participate in the care of your patient.Armstead Peaks., MD

## 2016-01-30 ENCOUNTER — Inpatient Hospital Stay: Payer: Medicare Other

## 2016-01-30 DIAGNOSIS — I714 Abdominal aortic aneurysm, without rupture: Secondary | ICD-10-CM | POA: Diagnosis not present

## 2016-01-30 DIAGNOSIS — N400 Enlarged prostate without lower urinary tract symptoms: Secondary | ICD-10-CM | POA: Diagnosis not present

## 2016-01-30 DIAGNOSIS — M542 Cervicalgia: Secondary | ICD-10-CM | POA: Diagnosis not present

## 2016-01-30 DIAGNOSIS — I10 Essential (primary) hypertension: Secondary | ICD-10-CM | POA: Diagnosis not present

## 2016-01-30 DIAGNOSIS — Z79899 Other long term (current) drug therapy: Secondary | ICD-10-CM | POA: Diagnosis not present

## 2016-01-30 DIAGNOSIS — R042 Hemoptysis: Secondary | ICD-10-CM | POA: Diagnosis not present

## 2016-01-30 DIAGNOSIS — R109 Unspecified abdominal pain: Secondary | ICD-10-CM | POA: Diagnosis not present

## 2016-01-30 DIAGNOSIS — C3431 Malignant neoplasm of lower lobe, right bronchus or lung: Secondary | ICD-10-CM | POA: Diagnosis not present

## 2016-01-30 DIAGNOSIS — M129 Arthropathy, unspecified: Secondary | ICD-10-CM | POA: Diagnosis not present

## 2016-01-30 DIAGNOSIS — C7931 Secondary malignant neoplasm of brain: Secondary | ICD-10-CM | POA: Diagnosis not present

## 2016-01-30 DIAGNOSIS — K219 Gastro-esophageal reflux disease without esophagitis: Secondary | ICD-10-CM | POA: Diagnosis not present

## 2016-01-30 DIAGNOSIS — J449 Chronic obstructive pulmonary disease, unspecified: Secondary | ICD-10-CM | POA: Diagnosis not present

## 2016-02-01 ENCOUNTER — Other Ambulatory Visit: Payer: Self-pay | Admitting: *Deleted

## 2016-02-01 ENCOUNTER — Inpatient Hospital Stay: Payer: Medicare Other

## 2016-02-01 ENCOUNTER — Encounter: Admission: RE | Payer: Self-pay | Source: Ambulatory Visit

## 2016-02-01 ENCOUNTER — Ambulatory Visit: Admission: RE | Admit: 2016-02-01 | Payer: Medicare Other | Source: Ambulatory Visit | Admitting: Vascular Surgery

## 2016-02-01 DIAGNOSIS — C3431 Malignant neoplasm of lower lobe, right bronchus or lung: Secondary | ICD-10-CM

## 2016-02-01 SURGERY — PORTA CATH INSERTION
Anesthesia: Moderate Sedation

## 2016-02-02 ENCOUNTER — Other Ambulatory Visit: Payer: Self-pay | Admitting: *Deleted

## 2016-02-04 NOTE — Progress Notes (Signed)
[place]  Telephone:([phone] [phone]  ID: James Faulkner. OB: [birth year]  MR#: [id number]  AOZ#:[id number]  Patient Care Team: [name], MD as PCP - General (Internal Medicine)  CHIEF COMPLAINT: Non-small cell carcinoma of the right lower lobe lung with brain metastasis.  INTERVAL HISTORY: Patient returns to clinic today for further evaluation and treatment planning. Chemotherapy was postponed secondary to his brain metastasis. He initiated XRT to the brain today. Patient admits has continued depression and anxiety, but otherwise feels well. He has no neurologic complaints. He denies any recent fevers or illnesses. He has a fair appetite, but denies weight loss. He denies any cough, hemoptysis, or shortness of breath. He has no nausea, vomiting, constipation, or diarrhea. He has no urinary complaints. Patient otherwise feels well and offers no further specific complaints.  REVIEW OF SYSTEMS:   Review of Systems  Constitutional: Negative.  Negative for fever, malaise/fatigue and weight loss.  Respiratory: Negative.  Negative for cough, hemoptysis and shortness of breath.   Cardiovascular: Negative.  Negative for chest pain and leg swelling.  Gastrointestinal: Negative.  Negative for abdominal pain.  Genitourinary: Negative.   Musculoskeletal: Negative.   Neurological: Negative.  Negative for sensory change, focal weakness and weakness.  Psychiatric/Behavioral: Positive for depression. Negative for suicidal ideas. The patient is nervous/anxious.    As per HPI. Otherwise, a complete review of systems is negative.   PAST MEDICAL HISTORY: Past Medical History:  Diagnosis Date  . Arthritis   . BPH (benign prostatic hyperplasia)   . COPD (chronic obstructive pulmonary disease) ([place])   . Cough    chronic  . GERD (gastroesophageal reflux disease)    h/o bleeding ulcer  . Hypertension     PAST SURGICAL HISTORY: Past Surgical History:    Procedure Laterality Date  . APPENDECTOMY    . CATARACT EXTRACTION W/PHACO Right 03/06/2015   Procedure: CATARACT EXTRACTION PHACO AND INTRAOCULAR LENS PLACEMENT ([place]);  Surgeon: Estill Cotta, MD;  Location: [facility] ORS;  Service: Ophthalmology;  Laterality: Right;  [place] 02:02 AP% 26.6 CDE 64.53 fluid pack lot # [id number]  . EYE SURGERY    . TONSILLECTOMY      FAMILY HISTORY: Reviewed and unchanged. No reported history of malignancy or chronic disease.  ADVANCED DIRECTIVES (Y/N):  N  HEALTH MAINTENANCE: Social History  Substance Use Topics  . Smoking status: Current Every Day Smoker    Packs/day: 1.00    Years: 60.00  . Smokeless tobacco: Current User    Types: Chew  . Alcohol use Yes     Colonoscopy:  PAP:  Bone density:  Lipid panel:  Allergies  Allergen Reactions  . Sulfa Antibiotics Swelling and Rash    Current Outpatient Prescriptions  Medication Sig Dispense Refill  . amLODipine (NORVASC) 5 MG tablet Take 5 mg by mouth daily.     Marland Kitchen dexamethasone (DECADRON) 4 MG tablet Take 1 tablet (4 mg total) by mouth 2 (two) times daily with a meal. 60 tablet 0  . fluticasone furoate-vilanterol (BREO ELLIPTA) 200-25 MCG/INH AEPB Inhale 1 puff into the lungs daily. 60 each 5  . gabapentin (NEURONTIN) 100 MG capsule Take 100 mg by mouth 3 (three) times daily.    Marland Kitchen LUMIGAN 0.01 % SOLN Place 1 drop into both eyes at bedtime.    . pantoprazole (PROTONIX) 40 MG tablet Take 40 mg by mouth daily.    . sertraline (ZOLOFT) 25 MG tablet Take 25 mg by mouth daily.    Marland Kitchen umeclidinium bromide (  INCRUSE ELLIPTA) 62.5 MCG/INH AEPB Inhale 1 puff into the lungs daily. 30 each 5   No current facility-administered medications for this visit.     OBJECTIVE: Vitals:   02/05/16 0847  BP: (!) 196/84  Pulse: 76  Resp: 18  Temp: 97.5 F (36.4 C)     Body mass index is 23.6 kg/m.    ECOG FS:0 - Asymptomatic  General: Well-developed, well-nourished, no acute distress. Eyes: Pink conjunctiva,  anicteric sclera. Lungs: Clear to auscultation bilaterally. Heart: Regular rate and rhythm. No rubs, murmurs, or gallops. Abdomen: Soft, nontender, nondistended. No organomegaly noted, normoactive bowel sounds. Musculoskeletal: No edema, cyanosis, or clubbing. Neuro: Alert, answering all questions appropriately. Cranial nerves grossly intact. Skin: No rashes or petechiae noted. Psych: Flat affect.   LAB RESULTS:  Lab Results  Component Value Date   NA 138 02/05/2016   K 4.8 02/05/2016   CL 103 02/05/2016   CO2 26 02/05/2016   GLUCOSE 126 (H) 02/05/2016   BUN 38 (H) 02/05/2016   CREATININE 1.17 02/05/2016   CALCIUM 10.2 02/05/2016   PROT 7.4 02/05/2016   ALBUMIN 3.5 02/05/2016   AST 25 02/05/2016   ALT 22 02/05/2016   ALKPHOS 61 02/05/2016   BILITOT 0.4 02/05/2016   GFRNONAA 59 (L) 02/05/2016   GFRAA >60 02/05/2016    Lab Results  Component Value Date   WBC 20.1 (H) 02/05/2016   NEUTROABS 18.4 (H) 02/05/2016   HGB 12.5 (L) 02/05/2016   HCT 36.6 (L) 02/05/2016   MCV 87.9 02/05/2016   PLT 426 02/05/2016     STUDIES: Dg Chest 1 View  Result Date: 01/18/2016 CLINICAL DATA:  Status post lung biopsy. EXAM: CHEST 1 VIEW COMPARISON:  01/01/2016 FINDINGS: Normal heart size. There is no left pleural effusion. The left lung is clear. Right pleural effusion and masslike consolidation of the right lower lobe is again noted. No pneumothorax visible status post percutaneous biopsy of right lung mass. IMPRESSION: 1. No complication status post right lung biopsy. Electronically Signed   By: Kerby Moors M.D.   On: 01/18/2016 12:04   Mr Jeri Cos FA Contrast  Result Date: 01/26/2016 CLINICAL DATA:  Right lower lobe lung cancer.  Initial staging. EXAM: MRI HEAD WITHOUT AND WITH CONTRAST TECHNIQUE: Multiplanar, multiecho pulse sequences of the brain and surrounding structures were obtained without and with intravenous contrast. CONTRAST:  64m MULTIHANCE GADOBENATE DIMEGLUMINE 529 MG/ML  IV SOLN COMPARISON:  None. FINDINGS: Brain: There is no evidence of acute infarct, intracranial hemorrhage, midline shift, or extra-axial fluid collection. Ventricular enlargement is favored to reflect moderate, central predominant cerebral atrophy rather than hydrocephalus. T2 hyperintensities in the subcortical and deep cerebral white matter and pons are nonspecific but compatible with mild-to-moderate chronic small vessel ischemic disease. A chronic lacunar infarct is present in the right basal ganglia/ corona radiata. There is a 7 mm enhancing lesion in the posterior right frontal lobe white matter at the level of the centrum semiovale (series 10, image 43) with minimal surrounding edema. There is a 2 mm enhancing lesion in the periventricular white matter of the right temporal lobe (series 10, image 23 and series 11, image 18). Subependymal enhancing nodules along the margins of the lateral ventricles measures 3 mm in the right parieto-occipital region (series 10, image 31 and series 12, image 7) and 4 mm in the left temporal lobe (series 10, image 24 and series 12, image 21). There is a 4 mm ring-enhancing subcortical lesion in the medial right occipital lobe (series  10, image 33). There is a 4 mm ring-enhancing subcortical lesion in the left frontal lobe (series 10, image 33 and series 11, image 7). Vascular: Abnormal appearance of the non dominant distal left vertebral artery may reflect slow flow or occlusion. Other major intracranial vascular flow voids are preserved. Skull and upper cervical spine: Unremarkable bone marrow signal. Sinuses/Orbits: Prior bilateral cataract extraction. Mild bilateral ethmoid and left maxillary sinus mucosal thickening. Clear mastoid air cells. Other: None. IMPRESSION: 1. Six subcentimeter enhancing supratentorial brain lesions consistent with metastases. No significant edema. 2. Mild to moderate chronic small vessel ischemic disease and moderate cerebral atrophy.  Electronically Signed   By: Logan Bores M.D.   On: 01/26/2016 09:24   Ct Biopsy  Result Date: 01/18/2016 INDICATION: 75 year old male with a hypermetabolic right lower lobe pulmonary mass. CT biopsy is warranted to facilitate tissue diagnosis. EXAM: CT-guided biopsy right lower lobe lobe pulmonary nodule Interventional Radiologist:  Criselda Peaches, MD MEDICATIONS: None. ANESTHESIA/SEDATION: Fentanyl 100 mcg IV; Versed 2 mg IV Moderate Sedation Time:  21 minutes The patient was continuously monitored during the procedure by the interventional radiology nurse under my direct supervision. FLUOROSCOPY TIME:  Fluoroscopy Time: 0 minutes 0 seconds (0 mGy). COMPLICATIONS: None immediate. Estimated blood loss:  0 PROCEDURE: Informed written consent was obtained from the patient after a thorough discussion of the procedural risks, benefits and alternatives. All questions were addressed. Maximal Sterile Barrier Technique was utilized including caps, mask, sterile gowns, sterile gloves, sterile drape, hand hygiene and skin antiseptic. A timeout was performed prior to the initiation of the procedure. A planning axial CT scan was performed. The mass in the right lower lobe was successfully identified. A suitable skin entry site was selected and marked. The region was then sterilely prepped and draped in standard fashion using Betadine skin prep. Local anesthesia was attained by infiltration with 1% lidocaine. A small dermatotomy was made. Under intermittent CT fluoroscopic guidance, a 17 gauge trocar needle was advanced into the lung and positioned at the margin of the nodule. Multiple 18 gauge core biopsies were then coaxially obtained using the BioPince automated biopsy device. Biopsy specimens were placed in formalin and delivered to pathology for further analysis. The biopsy device and introducer needle were removed. Post biopsy axial CT imaging demonstrates no evidence of immediate complication. There is no  pneumothorax. Mild perilesional alveolar hemorrhage is not unexpected. The patient tolerated the procedure well. IMPRESSION: Technically successful CT-guided biopsy right lower lobe pulmonary nodule. Signed, Criselda Peaches, MD [facility] Radiology Electronically Signed   By: Jacqulynn Cadet M.D.   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  PLAN:   1.  Non-small cell carcinoma of the right lower lobe lung with brain metastasis: CT and PET scan reviewed independently. Surgical pathology confirms poorly differentiated non-small cell carcinoma. MRI the brain revealed 6 individual sites of metastatic disease. Continue daily XRT to brain completing on February 20, 2016. Patient then will return to clinic in 1 week later for further evaluation and discussion of palliative concurrent chemotherapy with carboplatinum and Taxol along with daily XRT to his lungs. Once concurrent chemotherapy is completed, will consider systemic treatment with carboplatinum and Taxol.  2. Depression: Patient previously admitted to suicidal ideation, but none recently. He was offered antidepressants but has declined. Continue to monitor closely now the patient has known brain metastasis.  3. Leukocytosis: Likely secondary to Decadron for  brain metastasis. Monitor. 4. Hypertension: Patient's blood pressure is significantly elevated today, monitor.  Approximately 30 minutes was spent in discussion of which greater than 50% was consultation.  Patient expressed understanding and was in agreement with this plan. He also understands that He can call clinic at any time with any questions, concerns, or complaints.   Cancer of lower lobe of right lung [facility])   Staging form: Lung, AJCC 7th Edition   - Clinical stage from 01/29/2016: Stage IV (T3, N2, M1b) - Signed by Lloyd Huger, MD on 01/29/2016  Lloyd Huger, MD   02/05/2016 2:52 PM

## 2016-02-05 ENCOUNTER — Inpatient Hospital Stay: Payer: Medicare Other

## 2016-02-05 ENCOUNTER — Inpatient Hospital Stay (HOSPITAL_BASED_OUTPATIENT_CLINIC_OR_DEPARTMENT_OTHER): Payer: Medicare Other | Admitting: Oncology

## 2016-02-05 ENCOUNTER — Ambulatory Visit
Admission: RE | Admit: 2016-02-05 | Discharge: 2016-02-05 | Disposition: A | Discharge status: Home / Self Care | Payer: Medicare Other | Source: Ambulatory Visit | Attending: Radiation Oncology | Admitting: Radiation Oncology

## 2016-02-05 VITALS — BP 196/84 | HR 76 | Temp 97.5°F | Resp 18 | Wt 155.2 lb

## 2016-02-05 DIAGNOSIS — D72829 Elevated white blood cell count, unspecified: Secondary | ICD-10-CM

## 2016-02-05 DIAGNOSIS — I639 Cerebral infarction, unspecified: Secondary | ICD-10-CM

## 2016-02-05 DIAGNOSIS — F329 Major depressive disorder, single episode, unspecified: Secondary | ICD-10-CM

## 2016-02-05 DIAGNOSIS — R042 Hemoptysis: Secondary | ICD-10-CM | POA: Diagnosis not present

## 2016-02-05 DIAGNOSIS — I10 Essential (primary) hypertension: Secondary | ICD-10-CM

## 2016-02-05 DIAGNOSIS — I714 Abdominal aortic aneurysm, without rupture: Secondary | ICD-10-CM

## 2016-02-05 DIAGNOSIS — R591 Generalized enlarged lymph nodes: Secondary | ICD-10-CM | POA: Diagnosis not present

## 2016-02-05 DIAGNOSIS — M542 Cervicalgia: Secondary | ICD-10-CM | POA: Diagnosis not present

## 2016-02-05 DIAGNOSIS — J449 Chronic obstructive pulmonary disease, unspecified: Secondary | ICD-10-CM

## 2016-02-05 DIAGNOSIS — K219 Gastro-esophageal reflux disease without esophagitis: Secondary | ICD-10-CM

## 2016-02-05 DIAGNOSIS — F17210 Nicotine dependence, cigarettes, uncomplicated: Secondary | ICD-10-CM

## 2016-02-05 DIAGNOSIS — J90 Pleural effusion, not elsewhere classified: Secondary | ICD-10-CM | POA: Diagnosis not present

## 2016-02-05 DIAGNOSIS — M129 Arthropathy, unspecified: Secondary | ICD-10-CM | POA: Diagnosis not present

## 2016-02-05 DIAGNOSIS — Z9049 Acquired absence of other specified parts of digestive tract: Secondary | ICD-10-CM

## 2016-02-05 DIAGNOSIS — Z79899 Other long term (current) drug therapy: Secondary | ICD-10-CM

## 2016-02-05 DIAGNOSIS — C7931 Secondary malignant neoplasm of brain: Secondary | ICD-10-CM | POA: Diagnosis not present

## 2016-02-05 DIAGNOSIS — N400 Enlarged prostate without lower urinary tract symptoms: Secondary | ICD-10-CM

## 2016-02-05 DIAGNOSIS — R05 Cough: Secondary | ICD-10-CM

## 2016-02-05 DIAGNOSIS — C3431 Malignant neoplasm of lower lobe, right bronchus or lung: Secondary | ICD-10-CM | POA: Diagnosis not present

## 2016-02-05 DIAGNOSIS — I2510 Atherosclerotic heart disease of native coronary artery without angina pectoris: Secondary | ICD-10-CM

## 2016-02-05 DIAGNOSIS — F419 Anxiety disorder, unspecified: Secondary | ICD-10-CM

## 2016-02-05 DIAGNOSIS — R109 Unspecified abdominal pain: Secondary | ICD-10-CM | POA: Diagnosis not present

## 2016-02-05 LAB — CBC WITH DIFFERENTIAL/PLATELET
BASOS PCT: 0 %
Basophils Absolute: 0 10*3/uL (ref 0–0.1)
EOS ABS: 0 10*3/uL (ref 0–0.7)
EOS PCT: 0 %
HCT: 36.6 % — ABNORMAL LOW (ref 40.0–52.0)
HEMOGLOBIN: 12.5 g/dL — AB (ref 13.0–18.0)
Lymphocytes Relative: 2 %
Lymphs Abs: 0.5 10*3/uL — ABNORMAL LOW (ref 1.0–3.6)
MCH: 30 pg (ref 26.0–34.0)
MCHC: 34.1 g/dL (ref 32.0–36.0)
MCV: 87.9 fL (ref 80.0–100.0)
Monocytes Absolute: 1.2 10*3/uL — ABNORMAL HIGH (ref 0.2–1.0)
Monocytes Relative: 6 %
NEUTROS PCT: 92 %
Neutro Abs: 18.4 10*3/uL — ABNORMAL HIGH (ref 1.4–6.5)
PLATELETS: 426 10*3/uL (ref 150–440)
RBC: 4.16 MIL/uL — AB (ref 4.40–5.90)
RDW: 12.5 % (ref 11.5–14.5)
WBC: 20.1 10*3/uL — AB (ref 3.8–10.6)

## 2016-02-05 LAB — COMPREHENSIVE METABOLIC PANEL
ALBUMIN: 3.5 g/dL (ref 3.5–5.0)
ALK PHOS: 61 U/L (ref 38–126)
ALT: 22 U/L (ref 17–63)
ANION GAP: 9 (ref 5–15)
AST: 25 U/L (ref 15–41)
BUN: 38 mg/dL — ABNORMAL HIGH (ref 6–20)
CALCIUM: 10.2 mg/dL (ref 8.9–10.3)
CHLORIDE: 103 mmol/L (ref 101–111)
CO2: 26 mmol/L (ref 22–32)
CREATININE: 1.17 mg/dL (ref 0.61–1.24)
GFR calc non Af Amer: 59 mL/min — ABNORMAL LOW (ref >60)
GLUCOSE: 126 mg/dL — AB (ref 65–99)
Potassium: 4.8 mmol/L (ref 3.5–5.1)
SODIUM: 138 mmol/L (ref 135–145)
Total Bilirubin: 0.4 mg/dL (ref 0.3–1.2)
Total Protein: 7.4 g/dL (ref 6.5–8.1)

## 2016-02-05 NOTE — Progress Notes (Signed)
Complains of night sweats which started when started taking decadron. States that food tastes better and pt has better appetite.

## 2016-02-05 NOTE — Progress Notes (Signed)
START OFF PATHWAY REGIMEN - Non-Small Cell Lung  Off Pathway: Gemcitabine + Cisplatin (split-dose cisplatin) q21 days  [id number]:Gemcitabine + Cisplatin (split-dose cisplatin) q21 days:   A cycle is every 21 days:     Gemcitabine (Gemzar(R)) 1000 mg/m2 in 250 mL NS IV over 30 minutes days 1 and 8. Dose Mod: None     Cisplatin (Platinol(R)) 35 mg/m2 in 250 mL NS IV over 60 minutes days 1 and 8. **Prehydrate and consider post-hydration.** Dose Mod: None  **Always confirm dose/schedule in your pharmacy ordering system**    Patient Characteristics: Stage IV Metastatic, Squamous, PS = 0, 1, First Line, PD-L1 Expression Positive 1-49% (TPS) / Negative / Not Tested / Not a Candidate for Immunotherapy Check here if patient was staged using an edition prior to AJCC Staging - 8th Edition (i.e., prior to March 18, 2016)? false AJCC T Category: TX Current Disease Status: Distant Metastases AJCC N Category: NX AJCC M Category: M1c AJCC 8 Stage Grouping: IVB Histology: Squamous Cell Line of therapy: First Line PD-L1 Expression Status: Quantity Not Sufficient Performance Status: PS = 0, 1 Would you be surprised if this patient died  in the next year? I would NOT be surprised if this patient died in the next year  Intent of Therapy: Non-Curative / Palliative Intent, Discussed with Patient

## 2016-02-06 ENCOUNTER — Ambulatory Visit
Admission: RE | Admit: 2016-02-06 | Discharge: 2016-02-06 | Disposition: A | Discharge status: Home / Self Care | Payer: Medicare Other | Source: Ambulatory Visit | Attending: Radiation Oncology | Admitting: Radiation Oncology

## 2016-02-06 DIAGNOSIS — I714 Abdominal aortic aneurysm, without rupture: Secondary | ICD-10-CM | POA: Diagnosis not present

## 2016-02-06 DIAGNOSIS — R109 Unspecified abdominal pain: Secondary | ICD-10-CM | POA: Diagnosis not present

## 2016-02-06 DIAGNOSIS — I10 Essential (primary) hypertension: Secondary | ICD-10-CM | POA: Diagnosis not present

## 2016-02-06 DIAGNOSIS — Z79899 Other long term (current) drug therapy: Secondary | ICD-10-CM | POA: Diagnosis not present

## 2016-02-06 DIAGNOSIS — K219 Gastro-esophageal reflux disease without esophagitis: Secondary | ICD-10-CM | POA: Diagnosis not present

## 2016-02-06 DIAGNOSIS — C3431 Malignant neoplasm of lower lobe, right bronchus or lung: Secondary | ICD-10-CM | POA: Diagnosis not present

## 2016-02-06 DIAGNOSIS — N400 Enlarged prostate without lower urinary tract symptoms: Secondary | ICD-10-CM | POA: Diagnosis not present

## 2016-02-06 DIAGNOSIS — C7931 Secondary malignant neoplasm of brain: Secondary | ICD-10-CM | POA: Diagnosis not present

## 2016-02-06 DIAGNOSIS — J449 Chronic obstructive pulmonary disease, unspecified: Secondary | ICD-10-CM | POA: Diagnosis not present

## 2016-02-06 DIAGNOSIS — M129 Arthropathy, unspecified: Secondary | ICD-10-CM | POA: Diagnosis not present

## 2016-02-06 DIAGNOSIS — M542 Cervicalgia: Secondary | ICD-10-CM | POA: Diagnosis not present

## 2016-02-06 DIAGNOSIS — R042 Hemoptysis: Secondary | ICD-10-CM | POA: Diagnosis not present

## 2016-02-07 ENCOUNTER — Ambulatory Visit
Admission: RE | Admit: 2016-02-07 | Discharge: 2016-02-07 | Disposition: A | Discharge status: Home / Self Care | Payer: Medicare Other | Source: Ambulatory Visit | Attending: Radiation Oncology | Admitting: Radiation Oncology

## 2016-02-07 DIAGNOSIS — C7931 Secondary malignant neoplasm of brain: Secondary | ICD-10-CM | POA: Diagnosis not present

## 2016-02-07 DIAGNOSIS — I10 Essential (primary) hypertension: Secondary | ICD-10-CM | POA: Diagnosis not present

## 2016-02-07 DIAGNOSIS — R042 Hemoptysis: Secondary | ICD-10-CM | POA: Diagnosis not present

## 2016-02-07 DIAGNOSIS — J449 Chronic obstructive pulmonary disease, unspecified: Secondary | ICD-10-CM | POA: Diagnosis not present

## 2016-02-07 DIAGNOSIS — N400 Enlarged prostate without lower urinary tract symptoms: Secondary | ICD-10-CM | POA: Diagnosis not present

## 2016-02-07 DIAGNOSIS — K219 Gastro-esophageal reflux disease without esophagitis: Secondary | ICD-10-CM | POA: Diagnosis not present

## 2016-02-07 DIAGNOSIS — M542 Cervicalgia: Secondary | ICD-10-CM | POA: Diagnosis not present

## 2016-02-07 DIAGNOSIS — Z79899 Other long term (current) drug therapy: Secondary | ICD-10-CM | POA: Diagnosis not present

## 2016-02-07 DIAGNOSIS — M129 Arthropathy, unspecified: Secondary | ICD-10-CM | POA: Diagnosis not present

## 2016-02-07 DIAGNOSIS — R109 Unspecified abdominal pain: Secondary | ICD-10-CM | POA: Diagnosis not present

## 2016-02-07 DIAGNOSIS — C3431 Malignant neoplasm of lower lobe, right bronchus or lung: Secondary | ICD-10-CM | POA: Diagnosis not present

## 2016-02-07 DIAGNOSIS — I714 Abdominal aortic aneurysm, without rupture: Secondary | ICD-10-CM | POA: Diagnosis not present

## 2016-02-12 ENCOUNTER — Ambulatory Visit
Admission: RE | Admit: 2016-02-12 | Discharge: 2016-02-12 | Disposition: A | Discharge status: Home / Self Care | Payer: Medicare Other | Source: Ambulatory Visit | Attending: Radiation Oncology | Admitting: Radiation Oncology

## 2016-02-12 DIAGNOSIS — N400 Enlarged prostate without lower urinary tract symptoms: Secondary | ICD-10-CM | POA: Diagnosis not present

## 2016-02-12 DIAGNOSIS — J449 Chronic obstructive pulmonary disease, unspecified: Secondary | ICD-10-CM | POA: Diagnosis not present

## 2016-02-12 DIAGNOSIS — I10 Essential (primary) hypertension: Secondary | ICD-10-CM | POA: Diagnosis not present

## 2016-02-12 DIAGNOSIS — C3431 Malignant neoplasm of lower lobe, right bronchus or lung: Secondary | ICD-10-CM | POA: Diagnosis not present

## 2016-02-12 DIAGNOSIS — C7931 Secondary malignant neoplasm of brain: Secondary | ICD-10-CM | POA: Diagnosis not present

## 2016-02-12 DIAGNOSIS — Z79899 Other long term (current) drug therapy: Secondary | ICD-10-CM | POA: Diagnosis not present

## 2016-02-12 DIAGNOSIS — M542 Cervicalgia: Secondary | ICD-10-CM | POA: Diagnosis not present

## 2016-02-12 DIAGNOSIS — I714 Abdominal aortic aneurysm, without rupture: Secondary | ICD-10-CM | POA: Diagnosis not present

## 2016-02-12 DIAGNOSIS — R109 Unspecified abdominal pain: Secondary | ICD-10-CM | POA: Diagnosis not present

## 2016-02-12 DIAGNOSIS — R042 Hemoptysis: Secondary | ICD-10-CM | POA: Diagnosis not present

## 2016-02-12 DIAGNOSIS — M129 Arthropathy, unspecified: Secondary | ICD-10-CM | POA: Diagnosis not present

## 2016-02-12 DIAGNOSIS — K219 Gastro-esophageal reflux disease without esophagitis: Secondary | ICD-10-CM | POA: Diagnosis not present

## 2016-02-13 ENCOUNTER — Ambulatory Visit
Admission: RE | Admit: 2016-02-13 | Discharge: 2016-02-13 | Disposition: A | Discharge status: Home / Self Care | Payer: Medicare Other | Source: Ambulatory Visit | Attending: Radiation Oncology | Admitting: Radiation Oncology

## 2016-02-13 DIAGNOSIS — M129 Arthropathy, unspecified: Secondary | ICD-10-CM | POA: Diagnosis not present

## 2016-02-13 DIAGNOSIS — K219 Gastro-esophageal reflux disease without esophagitis: Secondary | ICD-10-CM | POA: Diagnosis not present

## 2016-02-13 DIAGNOSIS — I714 Abdominal aortic aneurysm, without rupture: Secondary | ICD-10-CM | POA: Diagnosis not present

## 2016-02-13 DIAGNOSIS — Z79899 Other long term (current) drug therapy: Secondary | ICD-10-CM | POA: Diagnosis not present

## 2016-02-13 DIAGNOSIS — R109 Unspecified abdominal pain: Secondary | ICD-10-CM | POA: Diagnosis not present

## 2016-02-13 DIAGNOSIS — R042 Hemoptysis: Secondary | ICD-10-CM | POA: Diagnosis not present

## 2016-02-13 DIAGNOSIS — N400 Enlarged prostate without lower urinary tract symptoms: Secondary | ICD-10-CM | POA: Diagnosis not present

## 2016-02-13 DIAGNOSIS — C3431 Malignant neoplasm of lower lobe, right bronchus or lung: Secondary | ICD-10-CM | POA: Diagnosis not present

## 2016-02-13 DIAGNOSIS — J449 Chronic obstructive pulmonary disease, unspecified: Secondary | ICD-10-CM | POA: Diagnosis not present

## 2016-02-13 DIAGNOSIS — C7931 Secondary malignant neoplasm of brain: Secondary | ICD-10-CM | POA: Diagnosis not present

## 2016-02-13 DIAGNOSIS — I10 Essential (primary) hypertension: Secondary | ICD-10-CM | POA: Diagnosis not present

## 2016-02-13 DIAGNOSIS — M542 Cervicalgia: Secondary | ICD-10-CM | POA: Diagnosis not present

## 2016-02-14 ENCOUNTER — Ambulatory Visit
Admission: RE | Admit: 2016-02-14 | Discharge: 2016-02-14 | Disposition: A | Discharge status: Home / Self Care | Payer: Medicare Other | Source: Ambulatory Visit | Attending: Radiation Oncology | Admitting: Radiation Oncology

## 2016-02-14 ENCOUNTER — Ambulatory Visit: Payer: Medicare Other

## 2016-02-14 DIAGNOSIS — I714 Abdominal aortic aneurysm, without rupture: Secondary | ICD-10-CM | POA: Diagnosis not present

## 2016-02-14 DIAGNOSIS — R109 Unspecified abdominal pain: Secondary | ICD-10-CM | POA: Diagnosis not present

## 2016-02-14 DIAGNOSIS — C3431 Malignant neoplasm of lower lobe, right bronchus or lung: Secondary | ICD-10-CM | POA: Diagnosis not present

## 2016-02-14 DIAGNOSIS — C7931 Secondary malignant neoplasm of brain: Secondary | ICD-10-CM | POA: Diagnosis not present

## 2016-02-14 DIAGNOSIS — N400 Enlarged prostate without lower urinary tract symptoms: Secondary | ICD-10-CM | POA: Diagnosis not present

## 2016-02-14 DIAGNOSIS — K219 Gastro-esophageal reflux disease without esophagitis: Secondary | ICD-10-CM | POA: Diagnosis not present

## 2016-02-14 DIAGNOSIS — Z79899 Other long term (current) drug therapy: Secondary | ICD-10-CM | POA: Diagnosis not present

## 2016-02-14 DIAGNOSIS — M129 Arthropathy, unspecified: Secondary | ICD-10-CM | POA: Diagnosis not present

## 2016-02-14 DIAGNOSIS — M542 Cervicalgia: Secondary | ICD-10-CM | POA: Diagnosis not present

## 2016-02-14 DIAGNOSIS — R042 Hemoptysis: Secondary | ICD-10-CM | POA: Diagnosis not present

## 2016-02-14 DIAGNOSIS — J449 Chronic obstructive pulmonary disease, unspecified: Secondary | ICD-10-CM | POA: Diagnosis not present

## 2016-02-14 DIAGNOSIS — I10 Essential (primary) hypertension: Secondary | ICD-10-CM | POA: Diagnosis not present

## 2016-02-15 ENCOUNTER — Ambulatory Visit
Admission: RE | Admit: 2016-02-15 | Discharge: 2016-02-15 | Disposition: A | Discharge status: Home / Self Care | Payer: Medicare Other | Source: Ambulatory Visit | Attending: Radiation Oncology | Admitting: Radiation Oncology

## 2016-02-15 ENCOUNTER — Encounter: Payer: Self-pay | Admitting: Oncology

## 2016-02-15 ENCOUNTER — Ambulatory Visit: Payer: Medicare Other

## 2016-02-15 DIAGNOSIS — M129 Arthropathy, unspecified: Secondary | ICD-10-CM | POA: Diagnosis not present

## 2016-02-15 DIAGNOSIS — J449 Chronic obstructive pulmonary disease, unspecified: Secondary | ICD-10-CM | POA: Diagnosis not present

## 2016-02-15 DIAGNOSIS — M542 Cervicalgia: Secondary | ICD-10-CM | POA: Diagnosis not present

## 2016-02-15 DIAGNOSIS — R042 Hemoptysis: Secondary | ICD-10-CM | POA: Diagnosis not present

## 2016-02-15 DIAGNOSIS — R109 Unspecified abdominal pain: Secondary | ICD-10-CM | POA: Diagnosis not present

## 2016-02-15 DIAGNOSIS — I10 Essential (primary) hypertension: Secondary | ICD-10-CM | POA: Diagnosis not present

## 2016-02-15 DIAGNOSIS — C3431 Malignant neoplasm of lower lobe, right bronchus or lung: Secondary | ICD-10-CM | POA: Diagnosis not present

## 2016-02-15 DIAGNOSIS — N400 Enlarged prostate without lower urinary tract symptoms: Secondary | ICD-10-CM | POA: Diagnosis not present

## 2016-02-15 DIAGNOSIS — I714 Abdominal aortic aneurysm, without rupture: Secondary | ICD-10-CM | POA: Diagnosis not present

## 2016-02-15 DIAGNOSIS — C7931 Secondary malignant neoplasm of brain: Secondary | ICD-10-CM | POA: Diagnosis not present

## 2016-02-15 DIAGNOSIS — K219 Gastro-esophageal reflux disease without esophagitis: Secondary | ICD-10-CM | POA: Diagnosis not present

## 2016-02-15 DIAGNOSIS — Z79899 Other long term (current) drug therapy: Secondary | ICD-10-CM | POA: Diagnosis not present

## 2016-02-16 ENCOUNTER — Ambulatory Visit
Admission: RE | Admit: 2016-02-16 | Discharge: 2016-02-16 | Disposition: A | Discharge status: Home / Self Care | Payer: Medicare Other | Source: Ambulatory Visit | Attending: Radiation Oncology | Admitting: Radiation Oncology

## 2016-02-16 DIAGNOSIS — R109 Unspecified abdominal pain: Secondary | ICD-10-CM | POA: Diagnosis not present

## 2016-02-16 DIAGNOSIS — Z79899 Other long term (current) drug therapy: Secondary | ICD-10-CM | POA: Diagnosis not present

## 2016-02-16 DIAGNOSIS — I10 Essential (primary) hypertension: Secondary | ICD-10-CM | POA: Diagnosis not present

## 2016-02-16 DIAGNOSIS — R042 Hemoptysis: Secondary | ICD-10-CM | POA: Diagnosis not present

## 2016-02-16 DIAGNOSIS — J449 Chronic obstructive pulmonary disease, unspecified: Secondary | ICD-10-CM | POA: Diagnosis not present

## 2016-02-16 DIAGNOSIS — I714 Abdominal aortic aneurysm, without rupture: Secondary | ICD-10-CM | POA: Diagnosis not present

## 2016-02-16 DIAGNOSIS — C7931 Secondary malignant neoplasm of brain: Secondary | ICD-10-CM | POA: Diagnosis not present

## 2016-02-16 DIAGNOSIS — M129 Arthropathy, unspecified: Secondary | ICD-10-CM | POA: Diagnosis not present

## 2016-02-16 DIAGNOSIS — M542 Cervicalgia: Secondary | ICD-10-CM | POA: Diagnosis not present

## 2016-02-16 DIAGNOSIS — C3431 Malignant neoplasm of lower lobe, right bronchus or lung: Secondary | ICD-10-CM | POA: Diagnosis not present

## 2016-02-16 DIAGNOSIS — K219 Gastro-esophageal reflux disease without esophagitis: Secondary | ICD-10-CM | POA: Diagnosis not present

## 2016-02-16 DIAGNOSIS — N400 Enlarged prostate without lower urinary tract symptoms: Secondary | ICD-10-CM | POA: Diagnosis not present

## 2016-02-19 ENCOUNTER — Ambulatory Visit
Admission: RE | Admit: 2016-02-19 | Discharge: 2016-02-19 | Disposition: A | Discharge status: Home / Self Care | Payer: Medicare Other | Source: Ambulatory Visit | Attending: Radiation Oncology | Admitting: Radiation Oncology

## 2016-02-19 DIAGNOSIS — M542 Cervicalgia: Secondary | ICD-10-CM | POA: Diagnosis not present

## 2016-02-19 DIAGNOSIS — J449 Chronic obstructive pulmonary disease, unspecified: Secondary | ICD-10-CM | POA: Diagnosis not present

## 2016-02-19 DIAGNOSIS — C3431 Malignant neoplasm of lower lobe, right bronchus or lung: Secondary | ICD-10-CM | POA: Diagnosis not present

## 2016-02-19 DIAGNOSIS — M129 Arthropathy, unspecified: Secondary | ICD-10-CM | POA: Diagnosis not present

## 2016-02-19 DIAGNOSIS — I10 Essential (primary) hypertension: Secondary | ICD-10-CM | POA: Diagnosis not present

## 2016-02-19 DIAGNOSIS — I714 Abdominal aortic aneurysm, without rupture: Secondary | ICD-10-CM | POA: Diagnosis not present

## 2016-02-19 DIAGNOSIS — R042 Hemoptysis: Secondary | ICD-10-CM | POA: Diagnosis not present

## 2016-02-19 DIAGNOSIS — N400 Enlarged prostate without lower urinary tract symptoms: Secondary | ICD-10-CM | POA: Diagnosis not present

## 2016-02-19 DIAGNOSIS — C7931 Secondary malignant neoplasm of brain: Secondary | ICD-10-CM | POA: Diagnosis not present

## 2016-02-19 DIAGNOSIS — R109 Unspecified abdominal pain: Secondary | ICD-10-CM | POA: Diagnosis not present

## 2016-02-19 DIAGNOSIS — Z79899 Other long term (current) drug therapy: Secondary | ICD-10-CM | POA: Diagnosis not present

## 2016-02-19 DIAGNOSIS — K219 Gastro-esophageal reflux disease without esophagitis: Secondary | ICD-10-CM | POA: Diagnosis not present

## 2016-02-20 ENCOUNTER — Ambulatory Visit
Admission: RE | Admit: 2016-02-20 | Discharge: 2016-02-20 | Disposition: A | Discharge status: Home / Self Care | Payer: Medicare Other | Source: Ambulatory Visit | Attending: Radiation Oncology | Admitting: Radiation Oncology

## 2016-02-20 DIAGNOSIS — K219 Gastro-esophageal reflux disease without esophagitis: Secondary | ICD-10-CM | POA: Diagnosis not present

## 2016-02-20 DIAGNOSIS — R109 Unspecified abdominal pain: Secondary | ICD-10-CM | POA: Diagnosis not present

## 2016-02-20 DIAGNOSIS — R042 Hemoptysis: Secondary | ICD-10-CM | POA: Diagnosis not present

## 2016-02-20 DIAGNOSIS — N400 Enlarged prostate without lower urinary tract symptoms: Secondary | ICD-10-CM | POA: Diagnosis not present

## 2016-02-20 DIAGNOSIS — C7931 Secondary malignant neoplasm of brain: Secondary | ICD-10-CM | POA: Diagnosis not present

## 2016-02-20 DIAGNOSIS — M542 Cervicalgia: Secondary | ICD-10-CM | POA: Diagnosis not present

## 2016-02-20 DIAGNOSIS — M129 Arthropathy, unspecified: Secondary | ICD-10-CM | POA: Diagnosis not present

## 2016-02-20 DIAGNOSIS — Z79899 Other long term (current) drug therapy: Secondary | ICD-10-CM | POA: Diagnosis not present

## 2016-02-20 DIAGNOSIS — I714 Abdominal aortic aneurysm, without rupture: Secondary | ICD-10-CM | POA: Diagnosis not present

## 2016-02-20 DIAGNOSIS — J449 Chronic obstructive pulmonary disease, unspecified: Secondary | ICD-10-CM | POA: Diagnosis not present

## 2016-02-20 DIAGNOSIS — I10 Essential (primary) hypertension: Secondary | ICD-10-CM | POA: Diagnosis not present

## 2016-02-20 DIAGNOSIS — C3431 Malignant neoplasm of lower lobe, right bronchus or lung: Secondary | ICD-10-CM | POA: Diagnosis not present

## 2016-02-21 ENCOUNTER — Encounter: Payer: Self-pay | Admitting: Oncology

## 2016-02-25 NOTE — Progress Notes (Signed)
[place]  Telephone:([phone] [phone]  ID: James Faulkner. OB: Jul 05, 1940  MR#: [id number]  SNK#:[id number]  Patient Care Team: [name], MD as PCP - General (Internal Medicine)  CHIEF COMPLAINT: Non-small cell carcinoma of the right lower lobe lung with brain metastasis.  INTERVAL HISTORY: Patient returns to clinic today for further evaluation and treatment planning. He has now completed XRT to his brain. Patient admits has continued depression and anxiety, but otherwise feels well. He has no neurologic complaints. He denies any recent fevers or illnesses. He has a fair appetite, but denies weight loss. He denies any cough, hemoptysis, or shortness of breath. He has no nausea, vomiting, constipation, or diarrhea. He has no urinary complaints. Patient offers no further specific complaints.  REVIEW OF SYSTEMS:   Review of Systems  Constitutional: Negative.  Negative for fever, malaise/fatigue and weight loss.  Respiratory: Negative.  Negative for cough, hemoptysis and shortness of breath.   Cardiovascular: Negative.  Negative for chest pain and leg swelling.  Gastrointestinal: Negative.  Negative for abdominal pain.  Genitourinary: Negative.   Musculoskeletal: Negative.   Neurological: Negative.  Negative for sensory change, focal weakness and weakness.  Psychiatric/Behavioral: Positive for depression. Negative for suicidal ideas. The patient is nervous/anxious.    As per HPI. Otherwise, a complete review of systems is negative.   PAST MEDICAL HISTORY: Past Medical History:  Diagnosis Date  . Arthritis   . BPH (benign prostatic hyperplasia)   . COPD (chronic obstructive pulmonary disease) ([place])   . Cough    chronic  . GERD (gastroesophageal reflux disease)    h/o bleeding ulcer  . Hypertension     PAST SURGICAL HISTORY: Past Surgical History:  Procedure Laterality Date  . APPENDECTOMY    . CATARACT EXTRACTION W/PHACO Right 03/06/2015    Procedure: CATARACT EXTRACTION PHACO AND INTRAOCULAR LENS PLACEMENT ([place]);  Surgeon: Estill Cotta, MD;  Location: [facility] ORS;  Service: Ophthalmology;  Laterality: Right;  [place] 02:02 AP% 26.6 CDE 64.53 fluid pack lot # [id number]  . EYE SURGERY    . TONSILLECTOMY      FAMILY HISTORY: Reviewed and unchanged. No reported history of malignancy or chronic disease.  ADVANCED DIRECTIVES (Y/N):  N  HEALTH MAINTENANCE: Social History  Substance Use Topics  . Smoking status: Current Every Day Smoker    Packs/day: 1.00    Years: 60.00  . Smokeless tobacco: Current User    Types: Chew  . Alcohol use Yes     Colonoscopy:  PAP:  Bone density:  Lipid panel:  Allergies  Allergen Reactions  . Sulfa Antibiotics Swelling and Rash    Current Outpatient Prescriptions  Medication Sig Dispense Refill  . amLODipine (NORVASC) 5 MG tablet Take 5 mg by mouth daily.     Marland Kitchen dexamethasone (DECADRON) 4 MG tablet Take 1 tablet (4 mg total) by mouth 2 (two) times daily with a meal. (Patient taking differently: Take 4 mg by mouth daily. Take 1 tab daily x 1 week, then take 0.5 tab daily.) 60 tablet 0  . fluticasone furoate-vilanterol (BREO ELLIPTA) 200-25 MCG/INH AEPB Inhale 1 puff into the lungs daily. 60 each 5  . gabapentin (NEURONTIN) 100 MG capsule Take 100 mg by mouth 3 (three) times daily.    Marland Kitchen LUMIGAN 0.01 % SOLN Place 1 drop into both eyes at bedtime.    . pantoprazole (PROTONIX) 40 MG tablet Take 40 mg by mouth daily.    . sertraline (ZOLOFT) 25 MG tablet Take 25 mg  by mouth daily.    Marland Kitchen umeclidinium bromide (INCRUSE ELLIPTA) 62.5 MCG/INH AEPB Inhale 1 puff into the lungs daily. 30 each 5   No current facility-administered medications for this visit.     OBJECTIVE: Vitals:   02/26/16 0958  BP: (!) 170/72  Pulse: 72  Resp: 18  Temp: 97.6 F (36.4 C)     Body mass index is 23.63 kg/m.    ECOG FS:0 - Asymptomatic  General: Well-developed, well-nourished, no acute distress. Eyes: Pink  conjunctiva, anicteric sclera. Lungs: Clear to auscultation bilaterally. Heart: Regular rate and rhythm. No rubs, murmurs, or gallops. Abdomen: Soft, nontender, nondistended. No organomegaly noted, normoactive bowel sounds. Musculoskeletal: No edema, cyanosis, or clubbing. Neuro: Alert, answering all questions appropriately. Cranial nerves grossly intact. Skin: No rashes or petechiae noted. Psych: Flat affect.   LAB RESULTS:  Lab Results  Component Value Date   NA 134 (L) 02/26/2016   K 4.7 02/26/2016   CL 102 02/26/2016   CO2 24 02/26/2016   GLUCOSE 100 (H) 02/26/2016   BUN 42 (H) 02/26/2016   CREATININE 1.04 02/26/2016   CALCIUM 9.4 02/26/2016   PROT 6.1 (L) 02/26/2016   ALBUMIN 3.0 (L) 02/26/2016   AST 29 02/26/2016   ALT 59 02/26/2016   ALKPHOS 98 02/26/2016   BILITOT 0.6 02/26/2016   GFRNONAA >60 02/26/2016   GFRAA >60 02/26/2016    Lab Results  Component Value Date   WBC 16.6 (H) 02/26/2016   NEUTROABS 14.6 (H) 02/26/2016   HGB 12.1 (L) 02/26/2016   HCT 36.0 (L) 02/26/2016   MCV 87.0 02/26/2016   PLT 180 02/26/2016     STUDIES: No results found.  ASSESSMENT: Non-small cell carcinoma of the right lower lobe lung with brain metastasis.  PLAN:   1.  Non-small cell carcinoma of the right lower lobe lung with brain metastasis: CT and PET scan reviewed independently. Surgical pathology confirms poorly differentiated non-small cell carcinoma. MRI the brain revealed 6 individual sites of metastatic disease. Patient has now completed XRT to his brain. Case discussed with radiation oncology with plan to do a palliative XRT to his chest in the next 2-3 weeks. Because of this, will initiate low-dose chemotherapy with carboplatinum AUC 2 and Taxol 45 mg/m weekly. Patient will likely receive 2-3 treatments prior to initiating XRT, but then will continue throughout the duration of XRT. At the conclusion, will consider 2 consolidation doses of carboplatinum and Taxol.  Patient's last chest imaging was completed in October, therefore will repeat chest CT prior to treatment to assess for interval change. Return to clinic in 1 week to initiate cycle 1. 2. Depression: Patient previously admitted to suicidal ideation, but none recently. He was offered antidepressants but has declined. Continue to monitor closely now the patient has known brain metastasis.  3. Leukocytosis: Likely secondary to Decadron for brain metastasis. Monitor. 4. Hypertension: Patient's blood pressure is significantly elevated today, monitor.  Approximately 30 minutes was spent in discussion of which greater than 50% was consultation.  Patient expressed understanding and was in agreement with this plan. He also understands that He can call clinic at any time with any questions, concerns, or complaints.   Cancer of lower lobe of right lung [facility])   Staging form: Lung, AJCC 7th Edition   - Clinical stage from 01/29/2016: Stage IV (T3, N2, M1b) - Signed by Lloyd Huger, MD on 01/29/2016  Lloyd Huger, MD   02/26/2016 12:40 PM

## 2016-02-26 ENCOUNTER — Inpatient Hospital Stay: Payer: Medicare Other

## 2016-02-26 ENCOUNTER — Inpatient Hospital Stay (HOSPITAL_BASED_OUTPATIENT_CLINIC_OR_DEPARTMENT_OTHER): Payer: Medicare Other | Admitting: Oncology

## 2016-02-26 ENCOUNTER — Inpatient Hospital Stay: Payer: Medicare Other | Attending: Oncology | Admitting: *Deleted

## 2016-02-26 VITALS — BP 170/72 | HR 72 | Temp 97.6°F | Resp 18 | Wt 155.4 lb

## 2016-02-26 DIAGNOSIS — Z5111 Encounter for antineoplastic chemotherapy: Secondary | ICD-10-CM | POA: Diagnosis not present

## 2016-02-26 DIAGNOSIS — I10 Essential (primary) hypertension: Secondary | ICD-10-CM | POA: Insufficient documentation

## 2016-02-26 DIAGNOSIS — R531 Weakness: Secondary | ICD-10-CM | POA: Insufficient documentation

## 2016-02-26 DIAGNOSIS — J449 Chronic obstructive pulmonary disease, unspecified: Secondary | ICD-10-CM | POA: Diagnosis not present

## 2016-02-26 DIAGNOSIS — Z923 Personal history of irradiation: Secondary | ICD-10-CM | POA: Insufficient documentation

## 2016-02-26 DIAGNOSIS — N400 Enlarged prostate without lower urinary tract symptoms: Secondary | ICD-10-CM | POA: Diagnosis not present

## 2016-02-26 DIAGNOSIS — F329 Major depressive disorder, single episode, unspecified: Secondary | ICD-10-CM

## 2016-02-26 DIAGNOSIS — F419 Anxiety disorder, unspecified: Secondary | ICD-10-CM | POA: Diagnosis not present

## 2016-02-26 DIAGNOSIS — K219 Gastro-esophageal reflux disease without esophagitis: Secondary | ICD-10-CM

## 2016-02-26 DIAGNOSIS — D72829 Elevated white blood cell count, unspecified: Secondary | ICD-10-CM

## 2016-02-26 DIAGNOSIS — C7931 Secondary malignant neoplasm of brain: Secondary | ICD-10-CM | POA: Diagnosis not present

## 2016-02-26 DIAGNOSIS — F17210 Nicotine dependence, cigarettes, uncomplicated: Secondary | ICD-10-CM | POA: Insufficient documentation

## 2016-02-26 DIAGNOSIS — C3431 Malignant neoplasm of lower lobe, right bronchus or lung: Secondary | ICD-10-CM | POA: Diagnosis not present

## 2016-02-26 DIAGNOSIS — E279 Disorder of adrenal gland, unspecified: Secondary | ICD-10-CM | POA: Insufficient documentation

## 2016-02-26 DIAGNOSIS — R0602 Shortness of breath: Secondary | ICD-10-CM | POA: Diagnosis not present

## 2016-02-26 DIAGNOSIS — I700 Atherosclerosis of aorta: Secondary | ICD-10-CM | POA: Diagnosis not present

## 2016-02-26 DIAGNOSIS — R5383 Other fatigue: Secondary | ICD-10-CM | POA: Insufficient documentation

## 2016-02-26 DIAGNOSIS — R590 Localized enlarged lymph nodes: Secondary | ICD-10-CM | POA: Insufficient documentation

## 2016-02-26 DIAGNOSIS — J90 Pleural effusion, not elsewhere classified: Secondary | ICD-10-CM | POA: Insufficient documentation

## 2016-02-26 LAB — COMPREHENSIVE METABOLIC PANEL
ALBUMIN: 3 g/dL — AB (ref 3.5–5.0)
ALK PHOS: 98 U/L (ref 38–126)
ALT: 59 U/L (ref 17–63)
ANION GAP: 8 (ref 5–15)
AST: 29 U/L (ref 15–41)
BUN: 42 mg/dL — ABNORMAL HIGH (ref 6–20)
CALCIUM: 9.4 mg/dL (ref 8.9–10.3)
CHLORIDE: 102 mmol/L (ref 101–111)
CO2: 24 mmol/L (ref 22–32)
CREATININE: 1.04 mg/dL (ref 0.61–1.24)
GFR calc Af Amer: >60 mL/min (ref >60)
GFR calc non Af Amer: >60 mL/min (ref >60)
GLUCOSE: 100 mg/dL — AB (ref 65–99)
Potassium: 4.7 mmol/L (ref 3.5–5.1)
SODIUM: 134 mmol/L — AB (ref 135–145)
Total Bilirubin: 0.6 mg/dL (ref 0.3–1.2)
Total Protein: 6.1 g/dL — ABNORMAL LOW (ref 6.5–8.1)

## 2016-02-26 LAB — CBC WITH DIFFERENTIAL/PLATELET
BASOS PCT: 0 %
Basophils Absolute: 0 10*3/uL (ref 0–0.1)
EOS ABS: 0.3 10*3/uL (ref 0–0.7)
EOS PCT: 2 %
HCT: 36 % — ABNORMAL LOW (ref 40.0–52.0)
HEMOGLOBIN: 12.1 g/dL — AB (ref 13.0–18.0)
Lymphocytes Relative: 5 %
Lymphs Abs: 0.8 10*3/uL — ABNORMAL LOW (ref 1.0–3.6)
MCH: 29.3 pg (ref 26.0–34.0)
MCHC: 33.7 g/dL (ref 32.0–36.0)
MCV: 87 fL (ref 80.0–100.0)
Monocytes Absolute: 1 10*3/uL (ref 0.2–1.0)
Monocytes Relative: 6 %
NEUTROS PCT: 87 %
Neutro Abs: 14.6 10*3/uL — ABNORMAL HIGH (ref 1.4–6.5)
PLATELETS: 180 10*3/uL (ref 150–440)
RBC: 4.14 MIL/uL — AB (ref 4.40–5.90)
RDW: 13.3 % (ref 11.5–14.5)
WBC: 16.6 10*3/uL — AB (ref 3.8–10.6)

## 2016-02-26 MED ORDER — PROCHLORPERAZINE MALEATE 10 MG PO TABS: 10.0000 mg | ORAL_TABLET | Freq: Four times a day (QID) | ORAL | 1 refills | Status: DC | PRN

## 2016-02-26 MED ORDER — ONDANSETRON HCL 8 MG PO TABS: 8.0000 mg | ORAL_TABLET | Freq: Two times a day (BID) | ORAL | 1 refills | Status: DC | PRN

## 2016-02-26 MED ORDER — LIDOCAINE-PRILOCAINE 2.5-2.5 % EX CREA: TOPICAL_CREAM | CUTANEOUS | 3 refills | Status: DC

## 2016-02-26 NOTE — Progress Notes (Signed)
Offers no complaints. Has questions regarding chemotherapy treatments.

## 2016-03-04 ENCOUNTER — Ambulatory Visit: Payer: Self-pay

## 2016-03-04 NOTE — Progress Notes (Signed)
[place]  Telephone:([phone] [phone]  ID: James Faulkner. 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  INTERVAL HISTORY: Patient returns to clinic today for further evaluation and treatment. He has now completed XRT to his brain. Patient admits to having extreme weakness and fatigue that began one week ago. This morning he was unable to get dressed and required his wife's assistance. He is also feeling short of breath and is requesting oxygen for home. He does not wish to have treatment today.  He is more interested in the results from CT scan of his chest that were done yesterday. He also states that he has been tapering off his decadron and is currently taking half a tablet ('2mg'$ ) and he has noticed a significant difference in his energy level. He wishes to re-start the full dose of decadron through the holiday. Patient admits to continued depression and anxiety. He has no neurologic complaints. He denies any recent fevers or illnesses. He has a fair appetite, but denies weight loss. He denies any cough or hemoptysis. He has no nausea, vomiting, constipation, or diarrhea. He has no urinary complaints. Patient offers no further specific complaints.  REVIEW OF SYSTEMS:   Review of Systems  Constitutional: Positive for malaise/fatigue. Negative for fever and weight loss.  Respiratory: Positive for shortness of breath. Negative for cough and hemoptysis.   Cardiovascular: Negative.  Negative for chest pain and leg swelling.  Gastrointestinal: Negative for abdominal pain.  Genitourinary: Negative.   Musculoskeletal: Negative.        Extreme Weakness and fatigue  Neurological: Positive for weakness. Negative for sensory change and focal weakness.  Psychiatric/Behavioral: Positive for  depression. Negative for suicidal ideas. The patient is nervous/anxious.    As per HPI. Otherwise, a complete review of systems is negative.   PAST MEDICAL HISTORY: Past Medical History:  Diagnosis Date  . Arthritis   . Asthma   . BPH (benign prostatic hyperplasia)   . COPD (chronic obstructive pulmonary disease) ([place])   . Cough    chronic  . GERD (gastroesophageal reflux disease)    h/o bleeding ulcer  . Hypertension   . Lung cancer ([place])     PAST SURGICAL HISTORY: Past Surgical History:  Procedure Laterality Date  . APPENDECTOMY    . CATARACT EXTRACTION W/PHACO Right 03/06/2015   Procedure: CATARACT EXTRACTION PHACO AND INTRAOCULAR LENS PLACEMENT ([place]);  Surgeon: Estill Cotta, MD;  Location: [facility] ORS;  Service: Ophthalmology;  Laterality: Right;  [place] 02:02 AP% 26.6 CDE 64.53 fluid pack lot # [id number]  . EYE SURGERY    . TONSILLECTOMY      FAMILY HISTORY: Reviewed and unchanged. No reported history of malignancy or chronic disease.  ADVANCED DIRECTIVES (Y/N):  N  HEALTH MAINTENANCE: Social History  Substance Use Topics  . Smoking status: Current Every Day Smoker    Packs/day: 1.00    Years: 60.00  . Smokeless tobacco: Current User    Types: Chew  . Alcohol use Yes     Colonoscopy:  PAP:  Bone density:  Lipid panel:  Allergies  Allergen Reactions  . Sulfa Antibiotics Swelling and Rash    Current Outpatient Prescriptions  Medication Sig Dispense Refill  . amLODipine (NORVASC) 5 MG tablet Take 5 mg by mouth daily.     Marland Kitchen dexamethasone (DECADRON) 4 MG tablet  Take 1 tablet (4 mg total) by mouth daily. 30 tablet 1  . fluticasone furoate-vilanterol (BREO ELLIPTA) 200-25 MCG/INH AEPB Inhale 1 puff into the lungs daily. 60 each 5  . gabapentin (NEURONTIN) 100 MG capsule Take 100 mg by mouth 3 (three) times daily.    Marland Kitchen lidocaine-prilocaine (EMLA) cream Apply to affected area once 30 g 3  . LUMIGAN 0.01 % SOLN Place 1 drop into both eyes at bedtime.    .  ondansetron (ZOFRAN) 8 MG tablet Take 1 tablet (8 mg total) by mouth 2 (two) times daily as needed for refractory nausea / vomiting. Start on day 3 after chemo. 30 tablet 1  . pantoprazole (PROTONIX) 40 MG tablet Take 40 mg by mouth daily.    . prochlorperazine (COMPAZINE) 10 MG tablet Take 1 tablet (10 mg total) by mouth every 6 (six) hours as needed (Nausea or vomiting). 30 tablet 1  . sertraline (ZOLOFT) 25 MG tablet Take 25 mg by mouth daily.    Marland Kitchen umeclidinium bromide (INCRUSE ELLIPTA) 62.5 MCG/INH AEPB Inhale 1 puff into the lungs daily. 30 each 5   No current facility-administered medications for this visit.     OBJECTIVE: Vitals:   03/06/16 1018  BP: 129/76  Pulse: 99  Resp: 18  Temp: (!) 96.3 F (35.7 C)     Body mass index is 23.4 kg/m.    ECOG FS:0 - Asymptomatic  General: Well-developed, well-nourished, no acute distress. Eyes: Pink conjunctiva, anicteric sclera. Lungs: Clear to auscultation bilaterally. Heart: Regular rate and rhythm. No rubs, murmurs, or gallops. Abdomen: Soft, nontender, nondistended. No organomegaly noted, normoactive bowel sounds. Musculoskeletal: No edema, cyanosis, or clubbing. Neuro: Alert, answering all questions appropriately. Cranial nerves grossly intact. Skin: No rashes or petechiae noted. Psych: Flat affect.   LAB RESULTS:  Lab Results  Component Value Date   NA 138 03/06/2016   K 5.1 03/06/2016   CL 105 03/06/2016   CO2 28 03/06/2016   GLUCOSE 108 (H) 03/06/2016   BUN 40 (H) 03/06/2016   CREATININE 1.11 03/06/2016   CALCIUM 9.5 03/06/2016   PROT 6.4 (L) 03/06/2016   ALBUMIN 3.1 (L) 03/06/2016   AST 26 03/06/2016   ALT 49 03/06/2016   ALKPHOS 76 03/06/2016   BILITOT 0.8 03/06/2016   GFRNONAA >60 03/06/2016   GFRAA >60 03/06/2016    Lab Results  Component Value Date   WBC 9.9 03/06/2016   NEUTROABS 8.0 (H) 03/06/2016   HGB 12.2 (L) 03/06/2016   HCT 35.9 (L) 03/06/2016   MCV 87.8 03/06/2016   PLT 204 03/06/2016      STUDIES: Ct Chest W Contrast  Result Date: 03/05/2016 CLINICAL DATA:  Followup right lower lobe non-small cell lung carcinoma. Worsening shortness of breath for 2 weeks. EXAM: CT CHEST WITH CONTRAST TECHNIQUE: Multidetector CT imaging of the chest was performed during intravenous contrast administration. CONTRAST:  54m ISOVUE-300 IOPAMIDOL (ISOVUE-300) INJECTION 61% COMPARISON:  PET-CT on 01/05/2016 FINDINGS: Cardiovascular: No acute findings. Coronary artery calcification. Mixed calcified and noncalcified aortic atherosclerosis. Mediastinum/Nodes: Subcarinal mediastinal lymphadenopathy measures 1.9 cm in short axis on image 86/8, without significant change since prior study. Right hilar lymphadenopathy measuring 2.5 cm on image 91/8 is also stable since previous study. No new areas of lymphadenopathy identified within the thorax. Lungs/Pleura: Tiny right pleural effusion has decreased in size since previous study. Large centrally necrotic mass in the right lower lobe has increased in size since previous study, currently measuring 9.9 x 9.5 cm compared to 8.5 x 7.9  cm previously. Moderate emphysema again noted. New mild patchy areas of airspace opacity are seen in the posterior left lower lobe, suspicious for infectious or inflammatory process. Small left lower lobe pulmonary nodules measuring 5 mm on image 121/4 and 131/4 remains stable compared to previous study. 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 Infrarenal abdominal aortic aneurysm is incompletely visualized on this study. Musculoskeletal:  No suspicious bone lesions. IMPRESSION: Mildly increased size of centrally necrotic right lower lobe mass. 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 Decrease in size of tiny right pleural effusion. Stable right hilar and mediastinal lymphadenopathy. Stable sub-cm left lower lobe pulmonary nodules. New mild patchy airspace opacities in  posterior left lower lobe, suspicious for infectious or inflammatory process. Recommend continued attention on follow-up CT. Moderate emphysema. Electronically Signed   By: Earle Gell M.D.   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  PLAN:   1.  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 MRI the brain revealed 6 individual sites of metastatic disease. Patient has now completed XRT to his brain. Case discussed with radiation oncology with plan to do a palliative XRT to his chest. Because of this, will initiate low-dose chemotherapy with carboplatinum AUC 2 and Taxol 45 mg/m weekly. Patient will likely receive 2-3 treatments prior to initiating XRT, but then will continue throughout the duration of XRT. At the conclusion, will consider 2 consolidation doses of carboplatinum and Taxol.  Patient's last chest imaging was completed in October. 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 He has a follow-up appointment on 03/08/16 with Dr. Baruch Gouty regarding his recently completed brain XRT and consideration of palliative radiation to the chest. He does not want treatment today due to his worsening fatigue and would like to return next week for consideration of the low dose chemotherapy. Will increase Decadron back up to 4 mg to help with weakness, energy level and appetite. Return to clinic in 1 week for labs and re-evlaution of cycle 1 low dose chemo. 2. Depression: Patient previously admitted to suicidal ideation, but none recently. He was offered antidepressants but has continued to decline. Continue to monitor closely now the patient has known brain metastasis. 3. Leukocytosis:  Likely secondary to Decadron for brain metastasis. Monitor. 4. Hypertension: Resolved.  Monitor. 5. Shortness of breath: Patient was able to maintain oxygen level above 90%. Does not meet requirements at this time. Will re-evaluate next week. 6. Fatigue/Weakness: Will skip treatment today. Will revaluate in one week. Discontinued decadron taper. Prescribed 4 mg decadron daily.   Approximately 30 minutes was spent in discussion of which greater than 50% was consultation.  Patient expressed understanding and was in agreement with this plan. He also understands that He can call clinic at any time with any questions, concerns, or complaints.   Cancer of lower lobe of right lung [facility])   Staging form: Lung, AJCC 7th Edition   - Clinical stage from 01/29/2016: Stage IV (T3, N2, M1b) - Signed by Lloyd Huger, MD on 01/29/2016   Faythe Casa, NP 03/06/2016   Patient was seen and evaluated independently and I agree with the assessment and plan as dictated above.  Patient's CT simulation for XRT is on March 15, 2016.  Lloyd Huger, MD 03/09/16 2:37 PM

## 2016-03-05 ENCOUNTER — Ambulatory Visit
Admission: RE | Admit: 2016-03-05 | Discharge: 2016-03-05 | Disposition: A | Discharge status: Home / Self Care | Payer: Medicare Other | Source: Ambulatory Visit | Attending: Oncology | Admitting: Oncology

## 2016-03-05 DIAGNOSIS — E279 Disorder of adrenal gland, unspecified: Secondary | ICD-10-CM | POA: Diagnosis not present

## 2016-03-05 DIAGNOSIS — R590 Localized enlarged lymph nodes: Secondary | ICD-10-CM | POA: Diagnosis not present

## 2016-03-05 DIAGNOSIS — J439 Emphysema, unspecified: Secondary | ICD-10-CM | POA: Diagnosis not present

## 2016-03-05 DIAGNOSIS — J90 Pleural effusion, not elsewhere classified: Secondary | ICD-10-CM | POA: Insufficient documentation

## 2016-03-05 DIAGNOSIS — C3431 Malignant neoplasm of lower lobe, right bronchus or lung: Secondary | ICD-10-CM

## 2016-03-05 HISTORY — DX: Unspecified asthma, uncomplicated: J45.909

## 2016-03-05 HISTORY — DX: Malignant neoplasm of unspecified part of unspecified bronchus or lung: C34.90

## 2016-03-05 MED ORDER — IOPAMIDOL (ISOVUE-300) INJECTION 61%
75.0000 mL | Freq: Once | INTRAVENOUS | Status: AC | PRN
  Administered 2016-03-05: 75 mL via INTRAVENOUS

## 2016-03-06 ENCOUNTER — Inpatient Hospital Stay (HOSPITAL_BASED_OUTPATIENT_CLINIC_OR_DEPARTMENT_OTHER): Payer: Medicare Other | Admitting: Oncology

## 2016-03-06 ENCOUNTER — Inpatient Hospital Stay: Payer: Medicare Other

## 2016-03-06 VITALS — BP 129/76 | HR 99 | Temp 96.3°F | Resp 18 | Wt 153.9 lb

## 2016-03-06 DIAGNOSIS — Z5111 Encounter for antineoplastic chemotherapy: Secondary | ICD-10-CM | POA: Diagnosis not present

## 2016-03-06 DIAGNOSIS — C3431 Malignant neoplasm of lower lobe, right bronchus or lung: Secondary | ICD-10-CM

## 2016-03-06 DIAGNOSIS — K219 Gastro-esophageal reflux disease without esophagitis: Secondary | ICD-10-CM

## 2016-03-06 DIAGNOSIS — R5383 Other fatigue: Secondary | ICD-10-CM | POA: Diagnosis not present

## 2016-03-06 DIAGNOSIS — Z923 Personal history of irradiation: Secondary | ICD-10-CM | POA: Diagnosis not present

## 2016-03-06 DIAGNOSIS — R531 Weakness: Secondary | ICD-10-CM

## 2016-03-06 DIAGNOSIS — E279 Disorder of adrenal gland, unspecified: Secondary | ICD-10-CM | POA: Diagnosis not present

## 2016-03-06 DIAGNOSIS — F17210 Nicotine dependence, cigarettes, uncomplicated: Secondary | ICD-10-CM

## 2016-03-06 DIAGNOSIS — I10 Essential (primary) hypertension: Secondary | ICD-10-CM | POA: Diagnosis not present

## 2016-03-06 DIAGNOSIS — N400 Enlarged prostate without lower urinary tract symptoms: Secondary | ICD-10-CM

## 2016-03-06 DIAGNOSIS — I700 Atherosclerosis of aorta: Secondary | ICD-10-CM | POA: Diagnosis not present

## 2016-03-06 DIAGNOSIS — J449 Chronic obstructive pulmonary disease, unspecified: Secondary | ICD-10-CM

## 2016-03-06 DIAGNOSIS — R0602 Shortness of breath: Secondary | ICD-10-CM | POA: Diagnosis not present

## 2016-03-06 DIAGNOSIS — R590 Localized enlarged lymph nodes: Secondary | ICD-10-CM | POA: Diagnosis not present

## 2016-03-06 DIAGNOSIS — C7931 Secondary malignant neoplasm of brain: Secondary | ICD-10-CM

## 2016-03-06 DIAGNOSIS — J90 Pleural effusion, not elsewhere classified: Secondary | ICD-10-CM | POA: Diagnosis not present

## 2016-03-06 DIAGNOSIS — F329 Major depressive disorder, single episode, unspecified: Secondary | ICD-10-CM | POA: Diagnosis not present

## 2016-03-06 DIAGNOSIS — F419 Anxiety disorder, unspecified: Secondary | ICD-10-CM

## 2016-03-06 DIAGNOSIS — D72829 Elevated white blood cell count, unspecified: Secondary | ICD-10-CM

## 2016-03-06 LAB — COMPREHENSIVE METABOLIC PANEL
ALBUMIN: 3.1 g/dL — AB (ref 3.5–5.0)
ALT: 49 U/L (ref 17–63)
AST: 26 U/L (ref 15–41)
Alkaline Phosphatase: 76 U/L (ref 38–126)
Anion gap: 5 (ref 5–15)
BUN: 40 mg/dL — AB (ref 6–20)
CHLORIDE: 105 mmol/L (ref 101–111)
CO2: 28 mmol/L (ref 22–32)
Calcium: 9.5 mg/dL (ref 8.9–10.3)
Creatinine, Ser: 1.11 mg/dL (ref 0.61–1.24)
GFR calc Af Amer: >60 mL/min (ref >60)
GFR calc non Af Amer: >60 mL/min (ref >60)
GLUCOSE: 108 mg/dL — AB (ref 65–99)
POTASSIUM: 5.1 mmol/L (ref 3.5–5.1)
Sodium: 138 mmol/L (ref 135–145)
Total Bilirubin: 0.8 mg/dL (ref 0.3–1.2)
Total Protein: 6.4 g/dL — ABNORMAL LOW (ref 6.5–8.1)

## 2016-03-06 LAB — CBC WITH DIFFERENTIAL/PLATELET
BASOS ABS: 0 10*3/uL (ref 0–0.1)
BASOS PCT: 0 %
EOS PCT: 2 %
Eosinophils Absolute: 0.2 10*3/uL (ref 0–0.7)
HEMATOCRIT: 35.9 % — AB (ref 40.0–52.0)
Hemoglobin: 12.2 g/dL — ABNORMAL LOW (ref 13.0–18.0)
Lymphocytes Relative: 12 %
Lymphs Abs: 1.2 10*3/uL (ref 1.0–3.6)
MCH: 29.9 pg (ref 26.0–34.0)
MCHC: 34.1 g/dL (ref 32.0–36.0)
MCV: 87.8 fL (ref 80.0–100.0)
MONO ABS: 0.4 10*3/uL (ref 0.2–1.0)
Monocytes Relative: 5 %
NEUTROS ABS: 8 10*3/uL — AB (ref 1.4–6.5)
Neutrophils Relative %: 81 %
PLATELETS: 204 10*3/uL (ref 150–440)
RBC: 4.09 MIL/uL — AB (ref 4.40–5.90)
RDW: 13.9 % (ref 11.5–14.5)
WBC: 9.9 10*3/uL (ref 3.8–10.6)

## 2016-03-06 MED ORDER — DEXAMETHASONE 4 MG PO TABS: 4.0000 mg | ORAL_TABLET | Freq: Every day | ORAL | 1 refills | Status: AC

## 2016-03-06 NOTE — Progress Notes (Signed)
Complains of feeling short of breath today and very weak. Has started to taper decadron and is currently taking 0.5 tablet daily this week.

## 2016-03-07 LAB — SURGICAL PATHOLOGY

## 2016-03-08 ENCOUNTER — Encounter: Payer: Self-pay | Admitting: Radiation Oncology

## 2016-03-08 ENCOUNTER — Ambulatory Visit
Admission: RE | Admit: 2016-03-08 | Discharge: 2016-03-08 | Disposition: A | Discharge status: Home / Self Care | Payer: Medicare Other | Source: Ambulatory Visit | Attending: Radiation Oncology | Admitting: Radiation Oncology

## 2016-03-08 ENCOUNTER — Encounter: Payer: Self-pay | Admitting: Oncology

## 2016-03-08 VITALS — BP 150/80 | HR 97 | Temp 97.3°F | Resp 24 | Wt 151.5 lb

## 2016-03-08 DIAGNOSIS — R5383 Other fatigue: Secondary | ICD-10-CM | POA: Insufficient documentation

## 2016-03-08 DIAGNOSIS — F419 Anxiety disorder, unspecified: Secondary | ICD-10-CM | POA: Insufficient documentation

## 2016-03-08 DIAGNOSIS — Z510 Encounter for antineoplastic radiation therapy: Secondary | ICD-10-CM | POA: Diagnosis not present

## 2016-03-08 DIAGNOSIS — Z923 Personal history of irradiation: Secondary | ICD-10-CM | POA: Diagnosis not present

## 2016-03-08 DIAGNOSIS — C7931 Secondary malignant neoplasm of brain: Secondary | ICD-10-CM | POA: Diagnosis not present

## 2016-03-08 DIAGNOSIS — C3431 Malignant neoplasm of lower lobe, right bronchus or lung: Secondary | ICD-10-CM | POA: Diagnosis not present

## 2016-03-08 DIAGNOSIS — F329 Major depressive disorder, single episode, unspecified: Secondary | ICD-10-CM | POA: Diagnosis not present

## 2016-03-08 NOTE — Progress Notes (Signed)
Radiation Oncology Follow up Note  Name: James Faulkner.   Date:   03/08/2016 MRN:  [id number] DOB: 12-27-1940    This 75 y.o. male presents to the clinic today for one-month follow-up status post palliative ration therapy to his brain for stage IV non-small cell lung cancer.Marland Kitchen  REFERRING PROVIDER: [name], MD  HPI: Patient is a 75 year old male now out 2 weeks having completed whole brain radiation therapy for stage IV non-small cell lung cancer of the right lower lobe. He was initially staged IIIB (T3 N3 M0) with a large right lower lobe lesion and mediastinal adenopathy. He was found to have brain metastasis and underwent whole brain radiation therapy patient is seen today in routine follow-up is doing poorly. He's having trouble ambulating quite fatigued.Marland Kitchen He saw medical oncology yesterday is pending restarted on his Decadron. Patient also has a known history of anxiety and depression. He states his breathing somewhat more labored no hemoptysis at this time. Recent CT scan shows progression of disease in his right lower lobe.  COMPLICATIONS OF TREATMENT: none  FOLLOW UP COMPLIANCE: keeps appointments   PHYSICAL EXAM:  BP (!) 150/80   Pulse 97   Temp 97.3 F (36.3 C)   Resp (!) 24   Wt 151 lb 7.3 oz (68.7 kg)   SpO2 94%   BMI 23.03 kg/m  No evidence of oral candidiasis is noted. Neurologic examination is unchanged. Well-developed well-nourished patient in NAD. HEENT reveals PERLA, EOMI, discs not visualized.  Oral cavity is clear. No oral mucosal lesions are identified. Neck is clear without evidence of cervical or supraclavicular adenopathy. Lungs are clear to A&P. Cardiac examination is essentially unremarkable with regular rate and rhythm without murmur rub or thrill. Abdomen is benign with no organomegaly or masses noted. Motor sensory and DTR levels are equal and symmetric in the upper and lower extremities. Cranial nerves II through XII are grossly intact. Proprioception is  intact. No peripheral adenopathy or edema is identified. No motor or sensory levels are noted. Crude visual fields are within normal range.  RADIOLOGY RESULTS: Recent CT scan reviewed compatible above-stated findings  PLAN: At this time am planning palliative radiation therapy with low dose concurrent chemotherapy to his right lower lobe and mediastinum. I've set up and personally ordered CT simulation next week. I am struck to the family to increase his Decadron to 4 mg twice a day and if his condition continues to worsen he is to go to the emergency room for possible admission. Will discuss the case personally with medical oncology. Risks and benefits again of palliative radiation therapy to his chest were discussed. Plan on 4000 cGy in 10 fractions.  I would like to take this opportunity to thank you for allowing me to participate in the care of your patient.Armstead Peaks., MD

## 2016-03-12 NOTE — Progress Notes (Signed)
[place]  Telephone:([phone] [phone]  ID: Candida Peeling. 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  INTERVAL HISTORY: Patient returns to clinic today for further evaluation and treatment. He has now completed XRT to his brain. Patient admits to having extreme weakness and fatigue that has been going on for several weeks. He continues to be unable to care for self with ADLs and shows up today in wheelchair. Is wife helps him with all ADLs. His appetite has improved slightly and he is able to hold down more solid foods. His wife said yesterday he was able to eat an entire steak and baked potato. He states today "I cannot continue to live like this". He wishes to proceed with treatment although he is not feeling up for it. Patient admits to continued depression and anxiety. He has no neurologic complaints. He denies any recent fevers or illnesses. He denies weight loss. He denies any cough or hemoptysis. He has no nausea, vomiting, constipation, or diarrhea. He has no urinary complaints. Patient offers no further specific complaints.  REVIEW OF SYSTEMS:   Review of Systems  Constitutional: Positive for malaise/fatigue. Negative for fever and weight loss.  Respiratory: Positive for shortness of breath. Negative for cough and hemoptysis.   Cardiovascular: Negative.  Negative for chest pain and leg swelling.  Gastrointestinal: Negative for abdominal pain.  Genitourinary: Negative.   Musculoskeletal: Negative.        Extreme Weakness and fatigue  Neurological: Positive for weakness. Negative for sensory change and focal weakness.  Psychiatric/Behavioral: Positive for depression. Negative for suicidal ideas. The patient is nervous/anxious.    As per HPI. Otherwise, a complete review of  systems is negative.   PAST MEDICAL HISTORY: Past Medical History:  Diagnosis Date  . Arthritis   . Asthma   . BPH (benign prostatic hyperplasia)   . COPD (chronic obstructive pulmonary disease) ([place])   . Cough    chronic  . GERD (gastroesophageal reflux disease)    h/o bleeding ulcer  . Hypertension   . Lung cancer ([place])     PAST SURGICAL HISTORY: Past Surgical History:  Procedure Laterality Date  . APPENDECTOMY    . CATARACT EXTRACTION W/PHACO Right 03/06/2015   Procedure: CATARACT EXTRACTION PHACO AND INTRAOCULAR LENS PLACEMENT ([place]);  Surgeon: Estill Cotta, MD;  Location: [facility] ORS;  Service: Ophthalmology;  Laterality: Right;  [place] 02:02 AP% 26.6 CDE 64.53 fluid pack lot # [id number]  . EYE SURGERY    . TONSILLECTOMY      FAMILY HISTORY: Reviewed and unchanged. No reported history of malignancy or chronic disease.  ADVANCED DIRECTIVES (Y/N):  N  HEALTH MAINTENANCE: Social History  Substance Use Topics  . Smoking status: Current Every Day Smoker    Packs/day: 1.00    Years: 60.00  . Smokeless tobacco: Current User    Types: Chew  . Alcohol use Yes     Colonoscopy:  PAP:  Bone density:  Lipid panel:  Allergies  Allergen Reactions  . Sulfa Antibiotics Swelling and Rash    Current Outpatient Prescriptions  Medication Sig Dispense Refill  . amLODipine (NORVASC) 5 MG tablet Take 5 mg by mouth daily.     Marland Kitchen dexamethasone (DECADRON) 4 MG tablet Take 1 tablet (4 mg total) by mouth daily. 30 tablet 1  . fluticasone furoate-vilanterol (BREO ELLIPTA)  200-25 MCG/INH AEPB Inhale 1 puff into the lungs daily. 60 each 5  . gabapentin (NEURONTIN) 100 MG capsule Take 100 mg by mouth 3 (three) times daily.    Marland Kitchen lidocaine-prilocaine (EMLA) cream Apply to affected area once 30 g 3  . LUMIGAN 0.01 % SOLN Place 1 drop into both eyes at bedtime.    . ondansetron (ZOFRAN) 8 MG tablet Take 1 tablet (8 mg total) by mouth 2 (two) times daily as needed for refractory nausea /  vomiting. Start on day 3 after chemo. 30 tablet 1  . pantoprazole (PROTONIX) 40 MG tablet Take 40 mg by mouth daily.    . prochlorperazine (COMPAZINE) 10 MG tablet Take 1 tablet (10 mg total) by mouth every 6 (six) hours as needed (Nausea or vomiting). 30 tablet 1  . sertraline (ZOLOFT) 25 MG tablet Take 25 mg by mouth daily.    Marland Kitchen umeclidinium bromide (INCRUSE ELLIPTA) 62.5 MCG/INH AEPB Inhale 1 puff into the lungs daily. 30 each 5   No current facility-administered medications for this visit.    Facility-Administered Medications Ordered in Other Visits  Medication Dose Route Frequency Provider Last Rate Last Dose  . CARBOplatin (PARAPLATIN) 170 mg in sodium chloride 0.9 % 250 mL chemo infusion  170 mg Intravenous Once Lloyd Huger, MD      . PACLitaxel (TAXOL) 84 mg in sodium chloride 0.9 % 250 mL chemo infusion (</= '80mg'$ /m2)  45 mg/m2 (Treatment Plan Recorded) Intravenous Once Lloyd Huger, MD 264 mL/hr at 03/13/16 1207 84 mg at 03/13/16 1207    OBJECTIVE: Vitals:   03/13/16 1005  BP: (!) 168/85  Pulse: 81  Resp: 18  Temp: (!) 96.8 F (36 C)     Body mass index is 23.11 kg/m.    ECOG FS:2 - Symptomatic, <50% confined to bed  General: Well-developed, well-nourished, no acute distress. Sitting in a wheelchair. Eyes: Pink conjunctiva, anicteric sclera. Lungs: Clear to auscultation bilaterally. Heart: Regular rate and rhythm. No rubs, murmurs, or gallops. Abdomen: Soft, nontender, nondistended. No organomegaly noted, normoactive bowel sounds. Musculoskeletal: No edema, cyanosis, or clubbing. Neuro: Alert, answering all questions appropriately. Cranial nerves grossly intact. Skin: No rashes or petechiae noted. Psych: Flat affect.   LAB RESULTS:  Lab Results  Component Value Date   NA 135 03/13/2016   K 5.1 03/13/2016   CL 102 03/13/2016   CO2 26 03/13/2016   GLUCOSE 107 (H) 03/13/2016   BUN 43 (H) 03/13/2016   CREATININE 1.05 03/13/2016   CALCIUM 9.9 03/13/2016    PROT 6.5 03/13/2016   ALBUMIN 3.1 (L) 03/13/2016   AST 23 03/13/2016   ALT 38 03/13/2016   ALKPHOS 77 03/13/2016   BILITOT 0.7 03/13/2016   GFRNONAA >60 03/13/2016   GFRAA >60 03/13/2016    Lab Results  Component Value Date   WBC 16.2 (H) 03/13/2016   NEUTROABS 13.3 (H) 03/13/2016   HGB 11.7 (L) 03/13/2016   HCT 34.2 (L) 03/13/2016   MCV 87.0 03/13/2016   PLT 247 03/13/2016     STUDIES: Ct Chest W Contrast  Result Date: 03/05/2016 CLINICAL DATA:  Followup right lower lobe non-small cell lung carcinoma. Worsening shortness of breath for 2 weeks. EXAM: CT CHEST WITH CONTRAST TECHNIQUE: Multidetector CT imaging of the chest was performed during intravenous contrast administration. CONTRAST:  45m ISOVUE-300 IOPAMIDOL (ISOVUE-300) INJECTION 61% COMPARISON:  PET-CT on 01/05/2016 FINDINGS: Cardiovascular: No acute findings. Coronary artery calcification. Mixed calcified and noncalcified aortic atherosclerosis. Mediastinum/Nodes: Subcarinal mediastinal lymphadenopathy measures 1.9  cm in short axis on image 86/8, without significant change since prior study. Right hilar lymphadenopathy measuring 2.5 cm on image 91/8 is also stable since previous study. No new areas of lymphadenopathy identified within the thorax. Lungs/Pleura: Tiny right pleural effusion has decreased in size since previous study. Large centrally necrotic mass in the right lower lobe has increased in size since previous study, currently measuring 9.9 x 9.5 cm compared to 8.5 x 7.9 cm previously. Moderate emphysema again noted. New mild patchy areas of airspace opacity are seen in the posterior left lower lobe, suspicious for infectious or inflammatory process. Small left lower lobe pulmonary nodules measuring 5 mm on image 121/4 and 131/4 remains stable compared to previous study. 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 Infrarenal abdominal aortic aneurysm is incompletely visualized on  this study. Musculoskeletal:  No suspicious bone lesions. IMPRESSION: Mildly increased size of centrally necrotic right lower lobe mass. 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 Decrease in size of tiny right pleural effusion. Stable right hilar and mediastinal lymphadenopathy. Stable sub-cm left lower lobe pulmonary nodules. New mild patchy airspace opacities in posterior left lower lobe, suspicious for infectious or inflammatory process. Recommend continued attention on follow-up CT. Moderate emphysema. Electronically Signed   By: Earle Gell M.D.   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  PLAN:   1.  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 MRI the brain revealed 6 individual sites of metastatic disease. Patient has now completed XRT to his brain. Case discussed with radiation oncology with plan to do a palliative XRT to his chest.   Dr. Grayland Ormond tried to initiate low-dose chemotherapy with carbo/taxol but the patient was not feeling up for it last week and wanted to get through the holidays. Will give first dose of Carbo/Taxol today and see how he does. XRT simulation with Dr. Donella Stade scheduled for Friday. Follow-up in one week for weekly Carbo/Taxol with XRT , labs and MD.   Patient's last chest imaging was completed in October. 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   2. Depression: Patient previously admitted suicidal ideation, but none recently. He was offered antidepressants but has continued to decline. Continue to monitor closely now the patient has known brain metastasis. 3. Leukocytosis: Likely secondary to Decadron for brain  metastasis. Monitor. 4. Hypertension: Patient's blood pressure elevated today, Monitor. 5. Shortness of breath: Stable. 6. Fatigue/Weakness: Continue with 4 mg Decadron daily.   Patient expressed understanding and was in agreement with this plan. He also understands that He can call clinic at any time with any questions, concerns, or complaints.   Cancer of lower lobe of right lung [facility])   Staging form: Lung, AJCC 7th Edition   - Clinical stage from 01/29/2016: Stage IV (T3, N2, M1b) - Signed by Lloyd Huger, MD on 01/29/2016   Faythe Casa, NP 03/06/2016   Patient was seen and evaluated independently and I agree with the assessment and plan as dictated above.  Lloyd Huger, MD 03/15/16 10:21 AM

## 2016-03-13 ENCOUNTER — Inpatient Hospital Stay: Payer: Medicare Other

## 2016-03-13 ENCOUNTER — Inpatient Hospital Stay (HOSPITAL_BASED_OUTPATIENT_CLINIC_OR_DEPARTMENT_OTHER): Payer: Medicare Other | Admitting: Oncology

## 2016-03-13 VITALS — BP 168/85 | HR 81 | Temp 96.8°F | Resp 18 | Wt 152.0 lb

## 2016-03-13 DIAGNOSIS — C3431 Malignant neoplasm of lower lobe, right bronchus or lung: Secondary | ICD-10-CM

## 2016-03-13 DIAGNOSIS — R531 Weakness: Secondary | ICD-10-CM | POA: Diagnosis not present

## 2016-03-13 DIAGNOSIS — N400 Enlarged prostate without lower urinary tract symptoms: Secondary | ICD-10-CM

## 2016-03-13 DIAGNOSIS — E279 Disorder of adrenal gland, unspecified: Secondary | ICD-10-CM

## 2016-03-13 DIAGNOSIS — R0602 Shortness of breath: Secondary | ICD-10-CM | POA: Diagnosis not present

## 2016-03-13 DIAGNOSIS — I700 Atherosclerosis of aorta: Secondary | ICD-10-CM

## 2016-03-13 DIAGNOSIS — R5383 Other fatigue: Secondary | ICD-10-CM

## 2016-03-13 DIAGNOSIS — C7931 Secondary malignant neoplasm of brain: Secondary | ICD-10-CM | POA: Diagnosis not present

## 2016-03-13 DIAGNOSIS — F329 Major depressive disorder, single episode, unspecified: Secondary | ICD-10-CM

## 2016-03-13 DIAGNOSIS — D72829 Elevated white blood cell count, unspecified: Secondary | ICD-10-CM | POA: Diagnosis not present

## 2016-03-13 DIAGNOSIS — J90 Pleural effusion, not elsewhere classified: Secondary | ICD-10-CM

## 2016-03-13 DIAGNOSIS — Z5111 Encounter for antineoplastic chemotherapy: Secondary | ICD-10-CM | POA: Diagnosis not present

## 2016-03-13 DIAGNOSIS — F419 Anxiety disorder, unspecified: Secondary | ICD-10-CM

## 2016-03-13 DIAGNOSIS — J449 Chronic obstructive pulmonary disease, unspecified: Secondary | ICD-10-CM | POA: Diagnosis not present

## 2016-03-13 DIAGNOSIS — I10 Essential (primary) hypertension: Secondary | ICD-10-CM

## 2016-03-13 DIAGNOSIS — F17210 Nicotine dependence, cigarettes, uncomplicated: Secondary | ICD-10-CM

## 2016-03-13 DIAGNOSIS — Z923 Personal history of irradiation: Secondary | ICD-10-CM

## 2016-03-13 DIAGNOSIS — K219 Gastro-esophageal reflux disease without esophagitis: Secondary | ICD-10-CM | POA: Diagnosis not present

## 2016-03-13 DIAGNOSIS — R590 Localized enlarged lymph nodes: Secondary | ICD-10-CM

## 2016-03-13 LAB — COMPREHENSIVE METABOLIC PANEL
ALBUMIN: 3.1 g/dL — AB (ref 3.5–5.0)
ALT: 38 U/L (ref 17–63)
ANION GAP: 7 (ref 5–15)
AST: 23 U/L (ref 15–41)
Alkaline Phosphatase: 77 U/L (ref 38–126)
BILIRUBIN TOTAL: 0.7 mg/dL (ref 0.3–1.2)
BUN: 43 mg/dL — ABNORMAL HIGH (ref 6–20)
CO2: 26 mmol/L (ref 22–32)
Calcium: 9.9 mg/dL (ref 8.9–10.3)
Chloride: 102 mmol/L (ref 101–111)
Creatinine, Ser: 1.05 mg/dL (ref 0.61–1.24)
GFR calc Af Amer: >60 mL/min (ref >60)
Glucose, Bld: 107 mg/dL — ABNORMAL HIGH (ref 65–99)
Potassium: 5.1 mmol/L (ref 3.5–5.1)
Sodium: 135 mmol/L (ref 135–145)
TOTAL PROTEIN: 6.5 g/dL (ref 6.5–8.1)

## 2016-03-13 LAB — CBC WITH DIFFERENTIAL/PLATELET
Basophils Absolute: 0 10*3/uL (ref 0–0.1)
Basophils Relative: 0 %
EOS PCT: 1 %
Eosinophils Absolute: 0.1 10*3/uL (ref 0–0.7)
HEMATOCRIT: 34.2 % — AB (ref 40.0–52.0)
Hemoglobin: 11.7 g/dL — ABNORMAL LOW (ref 13.0–18.0)
Lymphocytes Relative: 10 %
Lymphs Abs: 1.6 10*3/uL (ref 1.0–3.6)
MCH: 29.7 pg (ref 26.0–34.0)
MCHC: 34.1 g/dL (ref 32.0–36.0)
MCV: 87 fL (ref 80.0–100.0)
MONO ABS: 1.2 10*3/uL — AB (ref 0.2–1.0)
MONOS PCT: 7 %
NEUTROS ABS: 13.3 10*3/uL — AB (ref 1.4–6.5)
Neutrophils Relative %: 82 %
PLATELETS: 247 10*3/uL (ref 150–440)
RBC: 3.94 MIL/uL — ABNORMAL LOW (ref 4.40–5.90)
RDW: 13.9 % (ref 11.5–14.5)
WBC: 16.2 10*3/uL — ABNORMAL HIGH (ref 3.8–10.6)

## 2016-03-13 MED ORDER — DIPHENHYDRAMINE HCL 50 MG/ML IJ SOLN
25.0000 mg | Freq: Once | INTRAMUSCULAR | Status: AC
  Administered 2016-03-13: 25 mg via INTRAVENOUS
  Filled 2016-03-13: qty 1

## 2016-03-13 MED ORDER — DEXAMETHASONE SODIUM PHOSPHATE 10 MG/ML IJ SOLN
10.0000 mg | Freq: Once | INTRAMUSCULAR | Status: AC
  Administered 2016-03-13: 10 mg via INTRAVENOUS
  Filled 2016-03-13: qty 1

## 2016-03-13 MED ORDER — SODIUM CHLORIDE 0.9 % IV SOLN: 10.0000 mg | Freq: Once | INTRAVENOUS | Status: DC

## 2016-03-13 MED ORDER — FAMOTIDINE IN NACL 20-0.9 MG/50ML-% IV SOLN
20.0000 mg | Freq: Once | INTRAVENOUS | Status: AC
  Administered 2016-03-13: 20 mg via INTRAVENOUS
  Filled 2016-03-13: qty 50

## 2016-03-13 MED ORDER — SODIUM CHLORIDE 0.9 % IV SOLN
169.2000 mg | Freq: Once | INTRAVENOUS | Status: AC
  Administered 2016-03-13: 170 mg via INTRAVENOUS
  Filled 2016-03-13: qty 17

## 2016-03-13 MED ORDER — PALONOSETRON HCL INJECTION 0.25 MG/5ML
0.2500 mg | Freq: Once | INTRAVENOUS | Status: AC
  Administered 2016-03-13: 0.25 mg via INTRAVENOUS
  Filled 2016-03-13: qty 5

## 2016-03-13 MED ORDER — PACLITAXEL CHEMO INJECTION 300 MG/50ML
45.0000 mg/m2 | Freq: Once | INTRAVENOUS | Status: AC
  Administered 2016-03-13: 84 mg via INTRAVENOUS
  Filled 2016-03-13: qty 14

## 2016-03-13 MED ORDER — SODIUM CHLORIDE 0.9 % IV SOLN
Freq: Once | INTRAVENOUS | Status: AC
  Administered 2016-03-13: 11:00:00 via INTRAVENOUS
  Filled 2016-03-13: qty 1000

## 2016-03-13 NOTE — Progress Notes (Signed)
States is very weak with decreased energy today. Appetite has improved slightly since last week. Complains of left leg weakness and that his leg feels "paralyzed."

## 2016-03-15 ENCOUNTER — Ambulatory Visit
Admission: RE | Admit: 2016-03-15 | Discharge: 2016-03-15 | Disposition: A | Discharge status: Home / Self Care | Payer: Medicare Other | Source: Ambulatory Visit | Attending: Radiation Oncology | Admitting: Radiation Oncology

## 2016-03-15 DIAGNOSIS — R5383 Other fatigue: Secondary | ICD-10-CM | POA: Diagnosis not present

## 2016-03-15 DIAGNOSIS — C7931 Secondary malignant neoplasm of brain: Secondary | ICD-10-CM | POA: Diagnosis not present

## 2016-03-15 DIAGNOSIS — C3431 Malignant neoplasm of lower lobe, right bronchus or lung: Secondary | ICD-10-CM | POA: Diagnosis not present

## 2016-03-15 DIAGNOSIS — Z923 Personal history of irradiation: Secondary | ICD-10-CM | POA: Diagnosis not present

## 2016-03-15 DIAGNOSIS — Z510 Encounter for antineoplastic radiation therapy: Secondary | ICD-10-CM | POA: Diagnosis not present

## 2016-03-18 NOTE — Progress Notes (Signed)
[place]  [phone] [phone]  ID: James Faulk[place]. 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  INTERVAL HISTORY: Patient returns to clinic today for further evaluation and consideration of cycle 2 of carboplatinum and Taxol. He has not started XRT to his lungs. He continues to feel weak and fatigued, but is able to accomplish minimal ADLs. Patient admits to continued depression and anxiety. He has no neurologic complaints. He denies any recent fevers or illnesses. He denies weight loss. He denies any chest pain, cough, or hemoptysis. He has no nausea, vomiting, constipation, or diarrhea. He has no urinary complaints. Patient offers no further specific complaints.  REVIEW OF SYSTEMS:   Review of Systems  Constitutional: Positive for malaise/fatigue. Negative for fever and weight loss.  Respiratory: Positive for shortness of breath. Negative for cough and hemoptysis.   Cardiovascular: Negative.  Negative for chest pain and leg swelling.  Gastrointestinal: Negative for abdominal pain.  Genitourinary: Negative.   Musculoskeletal: Negative.        Extreme Weakness and fatigue  Neurological: Positive for weakness. Negative for sensory change and focal weakness.  Psychiatric/Behavioral: Positive for depression. Negative for suicidal ideas. The patient is nervous/anxious.    As per HPI. Otherwise, a complete review of systems is negative.   PAST MEDICAL HISTORY: Past Medical History:  Diagnosis Date  . Arthritis   . Asthma   . BPH (benign prostatic hyperplasia)   . COPD (chronic obstructive pulmonary disease) ([place])   . Cough    chronic  . GERD (gastroesophageal reflux disease)    h/o bleeding ulcer  . Hypertension   . Lung cancer ([place])     PAST SURGICAL  HISTORY: Past Surgical History:  Procedure Laterality Date  . APPENDECTOMY    . CATARACT EXTRACTION W/PHACO Right 03/06/2015   Procedure: CATARACT EXTRACTION PHACO AND INTRAOCULAR LENS PLACEMENT ([place]);  Surgeon: Estill Cotta, MD;  Location: [facility] ORS;  Service: Ophthalmology;  Laterality: Right;  [place] 02:02 AP% 26.6 CDE 64.53 fluid pack lot # [id number]  . EYE SURGERY    . TONSILLECTOMY      FAMILY HISTORY: Reviewed and unchanged. No reported history of malignancy or chronic disease.  ADVANCED DIRECTIVES (Y/N):  N  HEALTH MAINTENANCE: Social History  Substance Use Topics  . Smoking status: Current Every Day Smoker    Packs/day: 1.00    Years: 60.00  . Smokeless tobacco: Current User    Types: Chew  . Alcohol use Yes     Colonoscopy:  PAP:  Bone density:  Lipid panel:  Allergies  Allergen Reactions  . Sulfa Antibiotics Swelling and Rash    Current Outpatient Prescriptions  Medication Sig Dispense Refill  . amLODipine (NORVASC) 5 MG tablet Take 5 mg by mouth daily.     Marland Kitchen dexamethasone (DECADRON) 4 MG tablet Take 1 tablet (4 mg total) by mouth daily. 30 tablet 1  . fluticasone furoate-vilanterol (BREO ELLIPTA) 200-25 MCG/INH AEPB Inhale 1 puff into the lungs daily. 60 each 5  . gabapentin (NEURONTIN) 100 MG capsule Take 100 mg by mouth 3 (three) times daily.    Marland Kitchen lidocaine-prilocaine (EMLA) cream Apply to affected area once 30 g 3  . LUMIGAN 0.01 % SOLN Place 1 drop into both eyes at bedtime.    . ondansetron (ZOFRAN) 8 MG tablet Take 1 tablet (8  mg total) by mouth 2 (two) times daily as needed for refractory nausea / vomiting. Start on day 3 after chemo. 30 tablet 1  . pantoprazole (PROTONIX) 40 MG tablet Take 40 mg by mouth daily.    . prochlorperazine (COMPAZINE) 10 MG tablet Take 1 tablet (10 mg total) by mouth every 6 (six) hours as needed (Nausea or vomiting). 30 tablet 1  . sertraline (ZOLOFT) 25 MG tablet Take 25 mg by mouth daily.    Marland Kitchen umeclidinium bromide  (INCRUSE ELLIPTA) 62.5 MCG/INH AEPB Inhale 1 puff into the lungs daily. 30 each 5   No current facility-administered medications for this visit.     OBJECTIVE: Vitals:   03/20/16 0855  BP: 131/75  Pulse: 81  Resp: 18  Temp: 97.9 F (36.6 C)     Body mass index is 21.62 kg/m.    ECOG FS:2 - Symptomatic, <50% confined to bed  General: Well-developed, well-nourished, no acute distress. Sitting in a wheelchair. Eyes: Pink conjunctiva, anicteric sclera. Lungs: Clear to auscultation bilaterally. Heart: Regular rate and rhythm. No rubs, murmurs, or gallops. Abdomen: Soft, nontender, nondistended. No organomegaly noted, normoactive bowel sounds. Musculoskeletal: No edema, cyanosis, or clubbing. Neuro: Alert, answering all questions appropriately. Cranial nerves grossly intact. Skin: No rashes or petechiae noted. Psych: Flat affect.   LAB RESULTS:  Lab Results  Component Value Date   NA 136 03/20/2016   K 4.9 03/20/2016   CL 103 03/20/2016   CO2 28 03/20/2016   GLUCOSE 110 (H) 03/20/2016   BUN 40 (H) 03/20/2016   CREATININE 1.12 03/20/2016   CALCIUM 9.9 03/20/2016   PROT 6.5 03/20/2016   ALBUMIN 3.1 (L) 03/20/2016   AST 23 03/20/2016   ALT 43 03/20/2016   ALKPHOS 78 03/20/2016   BILITOT 0.6 03/20/2016   GFRNONAA >60 03/20/2016   GFRAA >60 03/20/2016    Lab Results  Component Value Date   WBC 16.3 (H) 03/20/2016   NEUTROABS 14.2 (H) 03/20/2016   HGB 11.6 (L) 03/20/2016   HCT 34.3 (L) 03/20/2016   MCV 86.8 03/20/2016   PLT 283 03/20/2016     STUDIES: Ct Chest W Contrast  Result Date: 03/05/2016 CLINICAL DATA:  Followup right lower lobe non-small cell lung carcinoma. Worsening shortness of breath for 2 weeks. EXAM: CT CHEST WITH CONTRAST TECHNIQUE: Multidetector CT imaging of the chest was performed during intravenous contrast administration. CONTRAST:  80m ISOVUE-300 IOPAMIDOL (ISOVUE-300) INJECTION 61% COMPARISON:  PET-CT on 01/05/2016 FINDINGS: Cardiovascular:  No acute findings. Coronary artery calcification. Mixed calcified and noncalcified aortic atherosclerosis. Mediastinum/Nodes: Subcarinal mediastinal lymphadenopathy measures 1.9 cm in short axis on image 86/8, without significant change since prior study. Right hilar lymphadenopathy measuring 2.5 cm on image 91/8 is also stable since previous study. No new areas of lymphadenopathy identified within the thorax. Lungs/Pleura: Tiny right pleural effusion has decreased in size since previous study. Large centrally necrotic mass in the right lower lobe has increased in size since previous study, currently measuring 9.9 x 9.5 cm compared to 8.5 x 7.9 cm previously. Moderate emphysema again noted. New mild patchy areas of airspace opacity are seen in the posterior left lower lobe, suspicious for infectious or inflammatory process. Small left lower lobe pulmonary nodules measuring 5 mm on image 121/4 and 131/4 remains stable compared to previous study. 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 Infrarenal abdominal aortic aneurysm is incompletely visualized on this study. Musculoskeletal:  No suspicious bone lesions. IMPRESSION: Mildly increased size of centrally necrotic  right lower lobe mass. 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 Decrease in size of tiny right pleural effusion. Stable right hilar and mediastinal lymphadenopathy. Stable sub-cm left lower lobe pulmonary nodules. New mild patchy airspace opacities in posterior left lower lobe, suspicious for infectious or inflammatory process. Recommend continued attention on follow-up CT. Moderate emphysema. Electronically Signed   By: Earle Gell M.D.   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  PLAN:   1.  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 MRI the brain revealed 6 individual sites of metastatic disease. Patient has now completed XRT to his brain. Case discussed with radiation oncology with plan to do a palliative XRT to his chest. Proceed with cycle 2 of weekly low dose carboplatin and Taxol. Patient's XRT was delayed and will initiate any treatments in approximately one week. 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 Return to clinic in 1 week for consideration of cycle 3.  2. Depression: Patient previously admitted suicidal ideation, but none recently. He was offered antidepressants but has continued to decline. Continue to monitor closely now the patient has known brain metastasis. 3. Leukocytosis: Likely secondary to Decadron for brain metastasis. Monitor. 4. Hypertension: Patient's blood pressure is within normal limits today. Monitor. 5. Shortness of breath: Stable. 6. Fatigue/Weakness: Continue with 4 mg Decadron daily.   Patient expressed understanding and was in agreement with this plan. He also understands that He can call clinic at any time with any questions, concerns, or complaints.   Cancer of lower lobe of right lung [facility])   Staging form: Lung, AJCC 7th Edition   - Clinical stage from 01/29/2016: Stage IV (T3, N2, M1b) - Signed by Lloyd Huger, MD on 01/29/2016    Lloyd Huger, MD 03/20/16 4:17 PM

## 2016-03-20 ENCOUNTER — Other Ambulatory Visit: Payer: Self-pay

## 2016-03-20 ENCOUNTER — Other Ambulatory Visit: Payer: Self-pay | Admitting: Oncology

## 2016-03-20 ENCOUNTER — Inpatient Hospital Stay: Payer: Medicare Other | Attending: Oncology | Admitting: Oncology

## 2016-03-20 ENCOUNTER — Inpatient Hospital Stay: Payer: Medicare Other

## 2016-03-20 VITALS — BP 131/75 | HR 81 | Temp 97.9°F | Resp 18 | Wt 142.2 lb

## 2016-03-20 DIAGNOSIS — I700 Atherosclerosis of aorta: Secondary | ICD-10-CM | POA: Insufficient documentation

## 2016-03-20 DIAGNOSIS — K219 Gastro-esophageal reflux disease without esophagitis: Secondary | ICD-10-CM | POA: Diagnosis not present

## 2016-03-20 DIAGNOSIS — C3431 Malignant neoplasm of lower lobe, right bronchus or lung: Secondary | ICD-10-CM

## 2016-03-20 DIAGNOSIS — R590 Localized enlarged lymph nodes: Secondary | ICD-10-CM

## 2016-03-20 DIAGNOSIS — C7931 Secondary malignant neoplasm of brain: Secondary | ICD-10-CM | POA: Diagnosis not present

## 2016-03-20 DIAGNOSIS — E279 Disorder of adrenal gland, unspecified: Secondary | ICD-10-CM | POA: Diagnosis not present

## 2016-03-20 DIAGNOSIS — R5383 Other fatigue: Secondary | ICD-10-CM | POA: Insufficient documentation

## 2016-03-20 DIAGNOSIS — R531 Weakness: Secondary | ICD-10-CM | POA: Diagnosis not present

## 2016-03-20 DIAGNOSIS — F419 Anxiety disorder, unspecified: Secondary | ICD-10-CM | POA: Diagnosis not present

## 2016-03-20 DIAGNOSIS — J90 Pleural effusion, not elsewhere classified: Secondary | ICD-10-CM | POA: Diagnosis not present

## 2016-03-20 DIAGNOSIS — D72829 Elevated white blood cell count, unspecified: Secondary | ICD-10-CM | POA: Insufficient documentation

## 2016-03-20 DIAGNOSIS — M129 Arthropathy, unspecified: Secondary | ICD-10-CM | POA: Diagnosis not present

## 2016-03-20 DIAGNOSIS — J45909 Unspecified asthma, uncomplicated: Secondary | ICD-10-CM

## 2016-03-20 DIAGNOSIS — J449 Chronic obstructive pulmonary disease, unspecified: Secondary | ICD-10-CM | POA: Insufficient documentation

## 2016-03-20 DIAGNOSIS — F329 Major depressive disorder, single episode, unspecified: Secondary | ICD-10-CM | POA: Diagnosis not present

## 2016-03-20 DIAGNOSIS — F17210 Nicotine dependence, cigarettes, uncomplicated: Secondary | ICD-10-CM

## 2016-03-20 DIAGNOSIS — R634 Abnormal weight loss: Secondary | ICD-10-CM | POA: Diagnosis not present

## 2016-03-20 DIAGNOSIS — Z5111 Encounter for antineoplastic chemotherapy: Secondary | ICD-10-CM | POA: Insufficient documentation

## 2016-03-20 DIAGNOSIS — Z923 Personal history of irradiation: Secondary | ICD-10-CM | POA: Diagnosis not present

## 2016-03-20 DIAGNOSIS — N400 Enlarged prostate without lower urinary tract symptoms: Secondary | ICD-10-CM | POA: Diagnosis not present

## 2016-03-20 DIAGNOSIS — Z79899 Other long term (current) drug therapy: Secondary | ICD-10-CM | POA: Diagnosis not present

## 2016-03-20 DIAGNOSIS — R0602 Shortness of breath: Secondary | ICD-10-CM | POA: Diagnosis not present

## 2016-03-20 DIAGNOSIS — I10 Essential (primary) hypertension: Secondary | ICD-10-CM | POA: Insufficient documentation

## 2016-03-20 LAB — COMPREHENSIVE METABOLIC PANEL
ALT: 43 U/L (ref 17–63)
ANION GAP: 5 (ref 5–15)
AST: 23 U/L (ref 15–41)
Albumin: 3.1 g/dL — ABNORMAL LOW (ref 3.5–5.0)
Alkaline Phosphatase: 78 U/L (ref 38–126)
BUN: 40 mg/dL — ABNORMAL HIGH (ref 6–20)
CHLORIDE: 103 mmol/L (ref 101–111)
CO2: 28 mmol/L (ref 22–32)
Calcium: 9.9 mg/dL (ref 8.9–10.3)
Creatinine, Ser: 1.12 mg/dL (ref 0.61–1.24)
GFR calc Af Amer: >60 mL/min (ref >60)
GFR calc non Af Amer: >60 mL/min (ref >60)
Glucose, Bld: 110 mg/dL — ABNORMAL HIGH (ref 65–99)
POTASSIUM: 4.9 mmol/L (ref 3.5–5.1)
SODIUM: 136 mmol/L (ref 135–145)
Total Bilirubin: 0.6 mg/dL (ref 0.3–1.2)
Total Protein: 6.5 g/dL (ref 6.5–8.1)

## 2016-03-20 LAB — CBC WITH DIFFERENTIAL/PLATELET
BASOS PCT: 0 %
Basophils Absolute: 0.1 10*3/uL (ref 0–0.1)
EOS ABS: 0.3 10*3/uL (ref 0–0.7)
Eosinophils Relative: 2 %
HCT: 34.3 % — ABNORMAL LOW (ref 40.0–52.0)
HEMOGLOBIN: 11.6 g/dL — AB (ref 13.0–18.0)
Lymphocytes Relative: 6 %
Lymphs Abs: 1 10*3/uL (ref 1.0–3.6)
MCH: 29.5 pg (ref 26.0–34.0)
MCHC: 34 g/dL (ref 32.0–36.0)
MCV: 86.8 fL (ref 80.0–100.0)
MONOS PCT: 5 %
Monocytes Absolute: 0.8 10*3/uL (ref 0.2–1.0)
NEUTROS PCT: 87 %
Neutro Abs: 14.2 10*3/uL — ABNORMAL HIGH (ref 1.4–6.5)
Platelets: 283 10*3/uL (ref 150–440)
RBC: 3.95 MIL/uL — ABNORMAL LOW (ref 4.40–5.90)
RDW: 13.7 % (ref 11.5–14.5)
WBC: 16.3 10*3/uL — ABNORMAL HIGH (ref 3.8–10.6)

## 2016-03-20 MED ORDER — PACLITAXEL CHEMO INJECTION 300 MG/50ML
45.0000 mg/m2 | Freq: Once | INTRAVENOUS | Status: AC
  Administered 2016-03-20: 84 mg via INTRAVENOUS
  Filled 2016-03-20: qty 14

## 2016-03-20 MED ORDER — DIPHENHYDRAMINE HCL 50 MG/ML IJ SOLN
25.0000 mg | Freq: Once | INTRAMUSCULAR | Status: AC
  Administered 2016-03-20: 25 mg via INTRAVENOUS
  Filled 2016-03-20: qty 1

## 2016-03-20 MED ORDER — PALONOSETRON HCL INJECTION 0.25 MG/5ML
0.2500 mg | Freq: Once | INTRAVENOUS | Status: AC
  Administered 2016-03-20: 0.25 mg via INTRAVENOUS
  Filled 2016-03-20: qty 5

## 2016-03-20 MED ORDER — DEXAMETHASONE SODIUM PHOSPHATE 10 MG/ML IJ SOLN
10.0000 mg | Freq: Once | INTRAMUSCULAR | Status: AC
  Administered 2016-03-20: 10 mg via INTRAVENOUS
  Filled 2016-03-20: qty 1

## 2016-03-20 MED ORDER — SODIUM CHLORIDE 0.9 % IV SOLN
Freq: Once | INTRAVENOUS | Status: AC
  Administered 2016-03-20: 10:00:00 via INTRAVENOUS
  Filled 2016-03-20: qty 1000

## 2016-03-20 MED ORDER — FAMOTIDINE IN NACL 20-0.9 MG/50ML-% IV SOLN
20.0000 mg | Freq: Once | INTRAVENOUS | Status: AC
  Administered 2016-03-20: 20 mg via INTRAVENOUS
  Filled 2016-03-20: qty 50

## 2016-03-20 MED ORDER — SODIUM CHLORIDE 0.9 % IV SOLN
161.8000 mg | Freq: Once | INTRAVENOUS | Status: AC
  Administered 2016-03-20: 160 mg via INTRAVENOUS
  Filled 2016-03-20: qty 16

## 2016-03-20 MED ORDER — DEXAMETHASONE SODIUM PHOSPHATE 100 MG/10ML IJ SOLN: 10.0000 mg | Freq: Once | INTRAMUSCULAR | Status: DC

## 2016-03-20 NOTE — Progress Notes (Signed)
Complains of feeling weaker. Decreased appetite.

## 2016-03-21 DIAGNOSIS — C7931 Secondary malignant neoplasm of brain: Secondary | ICD-10-CM | POA: Diagnosis not present

## 2016-03-21 DIAGNOSIS — C3431 Malignant neoplasm of lower lobe, right bronchus or lung: Secondary | ICD-10-CM | POA: Diagnosis not present

## 2016-03-21 DIAGNOSIS — Z923 Personal history of irradiation: Secondary | ICD-10-CM | POA: Diagnosis not present

## 2016-03-21 DIAGNOSIS — R5383 Other fatigue: Secondary | ICD-10-CM | POA: Diagnosis not present

## 2016-03-21 DIAGNOSIS — Z510 Encounter for antineoplastic radiation therapy: Secondary | ICD-10-CM | POA: Diagnosis not present

## 2016-03-25 NOTE — Progress Notes (Signed)
[place]  Telephone:([phone] [phone]  ID: James Faulkner. 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  INTERVAL HISTORY: Patient returns to clinic today for further evaluation and consideration of cycle 3 of carboplatinum and Taxol. He is getting concurrent daily XRT to his lungs, starting today and completing on 04/10/16. He continues to feel weak and fatigued, but is able to accomplish minimal ADLs. Patient admits to continued depression and anxiety. He complains of increasing difficulty hearing.  He has no neurologic other complaints. He denies any recent fevers or illnesses. He reports a reduced appetite and has had a 9 pound weight loss in the past 3 weeks. He reports 3-4/10 right upper chest pain, not taking or wanting any medications. No hemoptysis. He complains of shortness of breath at rest and has occasional cough. He has no nausea, vomiting, constipation, or diarrhea. He has no urinary complaints. Patient offers no further specific complaints.  REVIEW OF SYSTEMS:   Review of Systems  Constitutional: Positive for malaise/fatigue. Negative for fever and weight loss.  HENT: Positive for hearing loss.   Respiratory: Positive for shortness of breath. Negative for cough and hemoptysis.   Cardiovascular: Positive for chest pain. Negative for leg swelling.  Gastrointestinal: Negative for abdominal pain.  Genitourinary: Negative.   Musculoskeletal: Negative.        Extreme Weakness and fatigue  Neurological: Positive for weakness. Negative for sensory change and focal weakness.  Psychiatric/Behavioral: Positive for depression. Negative for suicidal ideas. The patient is nervous/anxious.    As per HPI. Otherwise, a complete review of systems is negative.   PAST MEDICAL  HISTORY: Past Medical History:  Diagnosis Date  . Arthritis   . Asthma   . BPH (benign prostatic hyperplasia)   . COPD (chronic obstructive pulmonary disease) ([place])   . Cough    chronic  . GERD (gastroesophageal reflux disease)    h/o bleeding ulcer  . Hypertension   . Lung cancer ([place])     PAST SURGICAL HISTORY: Past Surgical History:  Procedure Laterality Date  . APPENDECTOMY    . CATARACT EXTRACTION W/PHACO Right 03/06/2015   Procedure: CATARACT EXTRACTION PHACO AND INTRAOCULAR LENS PLACEMENT ([place]);  Surgeon: Estill Cotta, MD;  Location: [facility] ORS;  Service: Ophthalmology;  Laterality: Right;  [place] 02:02 AP% 26.6 CDE 64.53 fluid pack lot # [id number]  . EYE SURGERY    . TONSILLECTOMY      FAMILY HISTORY: Reviewed and unchanged. No reported history of malignancy or chronic disease.  ADVANCED DIRECTIVES (Y/N):  N  HEALTH MAINTENANCE: Social History  Substance Use Topics  . Smoking status: Current Every Day Smoker    Packs/day: 1.00    Years: 60.00  . Smokeless tobacco: Current User    Types: Chew  . Alcohol use Yes     Colonoscopy:  PAP:  Bone density:  Lipid panel:  Allergies  Allergen Reactions  . Sulfa Antibiotics Swelling and Rash    Current Outpatient Prescriptions  Medication Sig Dispense Refill  . amLODipine (NORVASC) 5 MG tablet Take 5 mg by mouth daily.     Marland Kitchen dexamethasone (DECADRON) 4 MG tablet Take 1 tablet (4 mg total) by mouth daily. 30 tablet 1  . fluticasone furoate-vilanterol (BREO ELLIPTA) 200-25 MCG/INH AEPB Inhale 1 puff into the lungs daily. 60 each 5  . gabapentin (NEURONTIN)  100 MG capsule Take 100 mg by mouth 3 (three) times daily.    Marland Kitchen lidocaine-prilocaine (EMLA) cream Apply to affected area once 30 g 3  . LUMIGAN 0.01 % SOLN Place 1 drop into both eyes at bedtime.    . ondansetron (ZOFRAN) 8 MG tablet Take 1 tablet (8 mg total) by mouth 2 (two) times daily as needed for refractory nausea / vomiting. Start on day 3 after chemo. 30  tablet 1  . pantoprazole (PROTONIX) 40 MG tablet Take 40 mg by mouth daily.    . prochlorperazine (COMPAZINE) 10 MG tablet Take 1 tablet (10 mg total) by mouth every 6 (six) hours as needed (Nausea or vomiting). 30 tablet 1  . sertraline (ZOLOFT) 25 MG tablet Take 25 mg by mouth daily.    Marland Kitchen umeclidinium bromide (INCRUSE ELLIPTA) 62.5 MCG/INH AEPB Inhale 1 puff into the lungs daily. 30 each 5   No current facility-administered medications for this visit.     OBJECTIVE: Vitals:   03/27/16 0944  BP: 139/80  Pulse: 94  Resp: 18  Temp: 98 F (36.7 C)     Body mass index is 21.74 kg/m.    ECOG FS:2 - Symptomatic, <50% confined to bed  General: Well-developed, well-nourished, no acute distress. Sitting in a wheelchair. Eyes: Pink conjunctiva, anicteric sclera. Lungs: Clear to auscultation bilaterally. Heart: Regular rate and rhythm. No rubs, murmurs, or gallops. Abdomen: Soft, nontender, nondistended. No organomegaly noted, normoactive bowel sounds. Musculoskeletal: No edema, cyanosis, or clubbing. Neuro: Alert, answering all questions appropriately. Cranial nerves grossly intact. Skin: No rashes or petechiae noted. Psych: Flat affect.   LAB RESULTS:  Lab Results  Component Value Date   NA 135 03/27/2016   K 4.2 03/27/2016   CL 106 03/27/2016   CO2 22 03/27/2016   GLUCOSE 127 (H) 03/27/2016   BUN 45 (H) 03/27/2016   CREATININE 1.17 03/27/2016   CALCIUM 9.4 03/27/2016   PROT 6.1 (L) 03/27/2016   ALBUMIN 3.1 (L) 03/27/2016   AST 23 03/27/2016   ALT 41 03/27/2016   ALKPHOS 71 03/27/2016   BILITOT 0.7 03/27/2016   GFRNONAA 59 (L) 03/27/2016   GFRAA >60 03/27/2016    Lab Results  Component Value Date   WBC 11.7 (H) 03/27/2016   NEUTROABS 10.1 (H) 03/27/2016   HGB 11.1 (L) 03/27/2016   HCT 32.3 (L) 03/27/2016   MCV 87.8 03/27/2016   PLT 213 03/27/2016     STUDIES: Ct Chest W Contrast  Result Date: 03/05/2016 CLINICAL DATA:  Followup right lower lobe non-small  cell lung carcinoma. Worsening shortness of breath for 2 weeks. EXAM: CT CHEST WITH CONTRAST TECHNIQUE: Multidetector CT imaging of the chest was performed during intravenous contrast administration. CONTRAST:  39m ISOVUE-300 IOPAMIDOL (ISOVUE-300) INJECTION 61% COMPARISON:  PET-CT on 01/05/2016 FINDINGS: Cardiovascular: No acute findings. Coronary artery calcification. Mixed calcified and noncalcified aortic atherosclerosis. Mediastinum/Nodes: Subcarinal mediastinal lymphadenopathy measures 1.9 cm in short axis on image 86/8, without significant change since prior study. Right hilar lymphadenopathy measuring 2.5 cm on image 91/8 is also stable since previous study. No new areas of lymphadenopathy identified within the thorax. Lungs/Pleura: Tiny right pleural effusion has decreased in size since previous study. Large centrally necrotic mass in the right lower lobe has increased in size since previous study, currently measuring 9.9 x 9.5 cm compared to 8.5 x 7.9 cm previously. Moderate emphysema again noted. New mild patchy areas of airspace opacity are seen in the posterior left lower lobe, suspicious for infectious or inflammatory  process. Small left lower lobe pulmonary nodules measuring 5 mm on image 121/4 and 131/4 remains stable compared to previous study. 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 Infrarenal abdominal aortic aneurysm is incompletely visualized on this study. Musculoskeletal:  No suspicious bone lesions. IMPRESSION: Mildly increased size of centrally necrotic right lower lobe mass. 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 Decrease in size of tiny right pleural effusion. Stable right hilar and mediastinal lymphadenopathy. Stable sub-cm left lower lobe pulmonary nodules. New mild patchy airspace opacities in posterior left lower lobe, suspicious for infectious or inflammatory process. Recommend continued attention on follow-up CT.  Moderate emphysema. Electronically Signed   By: Earle Gell M.D.   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  PLAN:   1.  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 MRI the brain revealed 6 individual sites of metastatic disease. Patient has now completed XRT to his brain. Case discussed with radiation oncology. Patient's palliative XRT to his chest was delayed and started today, 03/27/16. Proceed with cycle 3 of weekly low dose carboplatin and Taxol.  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 Return to clinic in 1 week for consideration of cycle 4.  2. Depression: Patient previously admitted suicidal ideation, but none recently. He was offered antidepressants but has continued to decline. Continue to monitor closely now the patient has known brain metastasis. 3. Leukocytosis: Likely secondary to Decadron for brain metastasis. Monitor. 4. Hypertension: Patient's blood pressure is within normal limits today. Monitor. 5. Shortness of breath: Stable. 6. Fatigue/Weakness: Continue with 4 mg Decadron daily.  7. Reduced appetite: Referral to nutritionist to assist with dietary intake.  Patient expressed understanding and was in agreement with this plan. He also understands that He can call clinic at any time with any questions, concerns, or complaints.   Cancer of lower lobe of right lung [facility])   Staging form: Lung, AJCC 7th Edition   - Clinical stage from 01/29/2016: Stage IV (T3, N2, M1b) - Signed by Lloyd Huger, MD on 01/29/2016   Lucendia Herrlich, NP  03/27/16 11:09 AM   Patient was seen and evaluated independently and I agree with the assessment and plan as  dictated above. By report, patient is also having problems and home. Home health was ordered, but patient refused to let the nurse in.  Lloyd Huger, MD 03/29/16 8:55 AM

## 2016-03-27 ENCOUNTER — Ambulatory Visit: Payer: Self-pay

## 2016-03-27 ENCOUNTER — Other Ambulatory Visit: Payer: Self-pay

## 2016-03-27 ENCOUNTER — Inpatient Hospital Stay: Payer: Medicare Other

## 2016-03-27 ENCOUNTER — Ambulatory Visit
Admission: RE | Admit: 2016-03-27 | Discharge: 2016-03-27 | Disposition: A | Discharge status: Home / Self Care | Payer: Medicare Other | Source: Ambulatory Visit | Attending: Radiation Oncology | Admitting: Radiation Oncology

## 2016-03-27 ENCOUNTER — Inpatient Hospital Stay (HOSPITAL_BASED_OUTPATIENT_CLINIC_OR_DEPARTMENT_OTHER): Payer: Medicare Other | Admitting: Oncology

## 2016-03-27 ENCOUNTER — Ambulatory Visit: Payer: Self-pay | Admitting: Oncology

## 2016-03-27 VITALS — BP 139/80 | HR 94 | Temp 98.0°F | Resp 18 | Wt 143.0 lb

## 2016-03-27 DIAGNOSIS — I700 Atherosclerosis of aorta: Secondary | ICD-10-CM

## 2016-03-27 DIAGNOSIS — R5383 Other fatigue: Secondary | ICD-10-CM

## 2016-03-27 DIAGNOSIS — F17210 Nicotine dependence, cigarettes, uncomplicated: Secondary | ICD-10-CM

## 2016-03-27 DIAGNOSIS — F419 Anxiety disorder, unspecified: Secondary | ICD-10-CM

## 2016-03-27 DIAGNOSIS — J90 Pleural effusion, not elsewhere classified: Secondary | ICD-10-CM | POA: Diagnosis not present

## 2016-03-27 DIAGNOSIS — C3431 Malignant neoplasm of lower lobe, right bronchus or lung: Secondary | ICD-10-CM

## 2016-03-27 DIAGNOSIS — F329 Major depressive disorder, single episode, unspecified: Secondary | ICD-10-CM

## 2016-03-27 DIAGNOSIS — J449 Chronic obstructive pulmonary disease, unspecified: Secondary | ICD-10-CM

## 2016-03-27 DIAGNOSIS — R531 Weakness: Secondary | ICD-10-CM

## 2016-03-27 DIAGNOSIS — R590 Localized enlarged lymph nodes: Secondary | ICD-10-CM

## 2016-03-27 DIAGNOSIS — K219 Gastro-esophageal reflux disease without esophagitis: Secondary | ICD-10-CM

## 2016-03-27 DIAGNOSIS — M129 Arthropathy, unspecified: Secondary | ICD-10-CM | POA: Diagnosis not present

## 2016-03-27 DIAGNOSIS — Z79899 Other long term (current) drug therapy: Secondary | ICD-10-CM

## 2016-03-27 DIAGNOSIS — Z923 Personal history of irradiation: Secondary | ICD-10-CM | POA: Diagnosis not present

## 2016-03-27 DIAGNOSIS — I10 Essential (primary) hypertension: Secondary | ICD-10-CM | POA: Diagnosis not present

## 2016-03-27 DIAGNOSIS — J45909 Unspecified asthma, uncomplicated: Secondary | ICD-10-CM | POA: Diagnosis not present

## 2016-03-27 DIAGNOSIS — Z510 Encounter for antineoplastic radiation therapy: Secondary | ICD-10-CM | POA: Diagnosis not present

## 2016-03-27 DIAGNOSIS — E279 Disorder of adrenal gland, unspecified: Secondary | ICD-10-CM | POA: Diagnosis not present

## 2016-03-27 DIAGNOSIS — N400 Enlarged prostate without lower urinary tract symptoms: Secondary | ICD-10-CM

## 2016-03-27 DIAGNOSIS — R0602 Shortness of breath: Secondary | ICD-10-CM

## 2016-03-27 DIAGNOSIS — C7931 Secondary malignant neoplasm of brain: Secondary | ICD-10-CM | POA: Diagnosis not present

## 2016-03-27 DIAGNOSIS — D72829 Elevated white blood cell count, unspecified: Secondary | ICD-10-CM | POA: Diagnosis not present

## 2016-03-27 DIAGNOSIS — R634 Abnormal weight loss: Secondary | ICD-10-CM

## 2016-03-27 DIAGNOSIS — Z5111 Encounter for antineoplastic chemotherapy: Secondary | ICD-10-CM | POA: Diagnosis not present

## 2016-03-27 LAB — CBC WITH DIFFERENTIAL/PLATELET
BASOS ABS: 0 10*3/uL (ref 0–0.1)
BASOS PCT: 0 %
EOS ABS: 0.2 10*3/uL (ref 0–0.7)
EOS PCT: 2 %
HCT: 32.3 % — ABNORMAL LOW (ref 40.0–52.0)
Hemoglobin: 11.1 g/dL — ABNORMAL LOW (ref 13.0–18.0)
LYMPHS PCT: 8 %
Lymphs Abs: 0.9 10*3/uL — ABNORMAL LOW (ref 1.0–3.6)
MCH: 30.1 pg (ref 26.0–34.0)
MCHC: 34.2 g/dL (ref 32.0–36.0)
MCV: 87.8 fL (ref 80.0–100.0)
Monocytes Absolute: 0.5 10*3/uL (ref 0.2–1.0)
Monocytes Relative: 4 %
Neutro Abs: 10.1 10*3/uL — ABNORMAL HIGH (ref 1.4–6.5)
Neutrophils Relative %: 86 %
Platelets: 213 10*3/uL (ref 150–440)
RBC: 3.68 MIL/uL — ABNORMAL LOW (ref 4.40–5.90)
RDW: 14.3 % (ref 11.5–14.5)
WBC: 11.7 10*3/uL — AB (ref 3.8–10.6)

## 2016-03-27 LAB — COMPREHENSIVE METABOLIC PANEL
ALBUMIN: 3.1 g/dL — AB (ref 3.5–5.0)
ALT: 41 U/L (ref 17–63)
AST: 23 U/L (ref 15–41)
Alkaline Phosphatase: 71 U/L (ref 38–126)
Anion gap: 7 (ref 5–15)
BUN: 45 mg/dL — AB (ref 6–20)
CHLORIDE: 106 mmol/L (ref 101–111)
CO2: 22 mmol/L (ref 22–32)
CREATININE: 1.17 mg/dL (ref 0.61–1.24)
Calcium: 9.4 mg/dL (ref 8.9–10.3)
GFR calc Af Amer: >60 mL/min (ref >60)
GFR calc non Af Amer: 59 mL/min — ABNORMAL LOW (ref >60)
GLUCOSE: 127 mg/dL — AB (ref 65–99)
Potassium: 4.2 mmol/L (ref 3.5–5.1)
SODIUM: 135 mmol/L (ref 135–145)
Total Bilirubin: 0.7 mg/dL (ref 0.3–1.2)
Total Protein: 6.1 g/dL — ABNORMAL LOW (ref 6.5–8.1)

## 2016-03-27 MED ORDER — FAMOTIDINE IN NACL 20-0.9 MG/50ML-% IV SOLN
20.0000 mg | Freq: Once | INTRAVENOUS | Status: AC
  Administered 2016-03-27: 20 mg via INTRAVENOUS
  Filled 2016-03-27: qty 50

## 2016-03-27 MED ORDER — DIPHENHYDRAMINE HCL 50 MG/ML IJ SOLN
25.0000 mg | Freq: Once | INTRAMUSCULAR | Status: AC
  Administered 2016-03-27: 25 mg via INTRAVENOUS
  Filled 2016-03-27: qty 1

## 2016-03-27 MED ORDER — SODIUM CHLORIDE 0.9 % IV SOLN
Freq: Once | INTRAVENOUS | Status: AC
  Administered 2016-03-27: 12:00:00 via INTRAVENOUS
  Filled 2016-03-27: qty 1000

## 2016-03-27 MED ORDER — SODIUM CHLORIDE 0.9 % IV SOLN: 10.0000 mg | Freq: Once | INTRAVENOUS | Status: DC

## 2016-03-27 MED ORDER — PACLITAXEL CHEMO INJECTION 300 MG/50ML
45.0000 mg/m2 | Freq: Once | INTRAVENOUS | Status: AC
  Administered 2016-03-27: 84 mg via INTRAVENOUS
  Filled 2016-03-27: qty 14

## 2016-03-27 MED ORDER — DEXAMETHASONE SODIUM PHOSPHATE 10 MG/ML IJ SOLN
10.0000 mg | Freq: Once | INTRAMUSCULAR | Status: AC
  Administered 2016-03-27: 10 mg via INTRAVENOUS
  Filled 2016-03-27: qty 1

## 2016-03-27 MED ORDER — PALONOSETRON HCL INJECTION 0.25 MG/5ML
0.2500 mg | Freq: Once | INTRAVENOUS | Status: AC
  Administered 2016-03-27: 0.25 mg via INTRAVENOUS
  Filled 2016-03-27: qty 5

## 2016-03-27 MED ORDER — CARBOPLATIN CHEMO INJECTION 450 MG/45ML
157.0000 mg | Freq: Once | INTRAVENOUS | Status: AC
  Administered 2016-03-27: 160 mg via INTRAVENOUS
  Filled 2016-03-27: qty 16

## 2016-03-28 ENCOUNTER — Telehealth: Payer: Self-pay | Admitting: *Deleted

## 2016-03-28 ENCOUNTER — Ambulatory Visit
Admission: RE | Admit: 2016-03-28 | Discharge: 2016-03-28 | Disposition: A | Discharge status: Home / Self Care | Payer: Medicare Other | Source: Ambulatory Visit | Attending: Radiation Oncology | Admitting: Radiation Oncology

## 2016-03-28 ENCOUNTER — Ambulatory Visit: Payer: Self-pay | Admitting: Radiation Oncology

## 2016-03-28 DIAGNOSIS — C7931 Secondary malignant neoplasm of brain: Secondary | ICD-10-CM | POA: Diagnosis not present

## 2016-03-28 DIAGNOSIS — R5383 Other fatigue: Secondary | ICD-10-CM | POA: Diagnosis not present

## 2016-03-28 DIAGNOSIS — C3431 Malignant neoplasm of lower lobe, right bronchus or lung: Secondary | ICD-10-CM | POA: Diagnosis not present

## 2016-03-28 DIAGNOSIS — Z923 Personal history of irradiation: Secondary | ICD-10-CM | POA: Diagnosis not present

## 2016-03-28 DIAGNOSIS — Z510 Encounter for antineoplastic radiation therapy: Secondary | ICD-10-CM | POA: Diagnosis not present

## 2016-03-28 NOTE — Telephone Encounter (Signed)
Patient refused [facility]

## 2016-03-29 ENCOUNTER — Ambulatory Visit
Admission: RE | Admit: 2016-03-29 | Discharge: 2016-03-29 | Disposition: A | Discharge status: Home / Self Care | Payer: Medicare Other | Source: Ambulatory Visit | Attending: Radiation Oncology | Admitting: Radiation Oncology

## 2016-03-29 DIAGNOSIS — C3431 Malignant neoplasm of lower lobe, right bronchus or lung: Secondary | ICD-10-CM | POA: Diagnosis not present

## 2016-03-29 DIAGNOSIS — C7931 Secondary malignant neoplasm of brain: Secondary | ICD-10-CM | POA: Diagnosis not present

## 2016-03-29 DIAGNOSIS — Z510 Encounter for antineoplastic radiation therapy: Secondary | ICD-10-CM | POA: Diagnosis not present

## 2016-03-29 DIAGNOSIS — Z923 Personal history of irradiation: Secondary | ICD-10-CM | POA: Diagnosis not present

## 2016-03-29 DIAGNOSIS — R5383 Other fatigue: Secondary | ICD-10-CM | POA: Diagnosis not present

## 2016-04-01 ENCOUNTER — Ambulatory Visit
Admission: RE | Admit: 2016-04-01 | Discharge: 2016-04-01 | Disposition: A | Discharge status: Home / Self Care | Payer: Medicare Other | Source: Ambulatory Visit | Attending: Radiation Oncology | Admitting: Radiation Oncology

## 2016-04-01 DIAGNOSIS — R5383 Other fatigue: Secondary | ICD-10-CM | POA: Diagnosis not present

## 2016-04-01 DIAGNOSIS — C7931 Secondary malignant neoplasm of brain: Secondary | ICD-10-CM | POA: Diagnosis not present

## 2016-04-01 DIAGNOSIS — Z923 Personal history of irradiation: Secondary | ICD-10-CM | POA: Diagnosis not present

## 2016-04-01 DIAGNOSIS — Z510 Encounter for antineoplastic radiation therapy: Secondary | ICD-10-CM | POA: Diagnosis not present

## 2016-04-01 DIAGNOSIS — C3431 Malignant neoplasm of lower lobe, right bronchus or lung: Secondary | ICD-10-CM | POA: Diagnosis not present

## 2016-04-01 NOTE — Progress Notes (Deleted)
[place]  [phone] [phone]  ID: James Faulkner. 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  INTERVAL HISTORY: Patient returns to clinic today for further evaluation and consideration of cycle 3 of carboplatinum and Taxol. He is getting concurrent daily XRT to his lungs, starting today and completing on 04/10/16. He continues to feel weak and fatigued, but is able to accomplish minimal ADLs. Patient admits to continued depression and anxiety. He complains of increasing difficulty hearing.  He has no neurologic other complaints. He denies any recent fevers or illnesses. He reports a reduced appetite and has had a 9 pound weight loss in the past 3 weeks. He reports 3-4/10 right upper chest pain, not taking or wanting any medications. No hemoptysis. He complains of shortness of breath at rest and has occasional cough. He has no nausea, vomiting, constipation, or diarrhea. He has no urinary complaints. Patient offers no further specific complaints.  REVIEW OF SYSTEMS:   Review of Systems  Constitutional: Positive for malaise/fatigue. Negative for fever and weight loss.  HENT: Positive for hearing loss.   Respiratory: Positive for shortness of breath. Negative for cough and hemoptysis.   Cardiovascular: Positive for chest pain. Negative for leg swelling.  Gastrointestinal: Negative for abdominal pain.  Genitourinary: Negative.   Musculoskeletal: Negative.        Extreme Weakness and fatigue  Neurological: Positive for weakness. Negative for sensory change and focal weakness.  Psychiatric/Behavioral: Positive for depression. Negative for suicidal ideas. The patient is nervous/anxious.    As per HPI. Otherwise, a complete review of systems is negative.   PAST MEDICAL  HISTORY: Past Medical History:  Diagnosis Date  . Arthritis   . Asthma   . BPH (benign prostatic hyperplasia)   . COPD (chronic obstructive pulmonary disease) ([place])   . Cough    chronic  . GERD (gastroesophageal reflux disease)    h/o bleeding ulcer  . Hypertension   . Lung cancer ([place])     PAST SURGICAL HISTORY: Past Surgical History:  Procedure Laterality Date  . APPENDECTOMY    . CATARACT EXTRACTION W/PHACO Right 03/06/2015   Procedure: CATARACT EXTRACTION PHACO AND INTRAOCULAR LENS PLACEMENT ([place]);  Surgeon: Estill Cotta, MD;  Location: [facility] ORS;  Service: Ophthalmology;  Laterality: Right;  [place] 02:02 AP% 26.6 CDE 64.53 fluid pack lot # [id number]  . EYE SURGERY    . TONSILLECTOMY      FAMILY HISTORY: Reviewed and unchanged. No reported history of malignancy or chronic disease.  ADVANCED DIRECTIVES (Y/N):  N  HEALTH MAINTENANCE: Social History  Substance Use Topics  . Smoking status: Current Every Day Smoker    Packs/day: 1.00    Years: 60.00  . Smokeless tobacco: Current User    Types: Chew  . Alcohol use Yes     Colonoscopy:  PAP:  Bone density:  Lipid panel:  Allergies  Allergen Reactions  . Sulfa Antibiotics Swelling and Rash    Current Outpatient Prescriptions  Medication Sig Dispense Refill  . amLODipine (NORVASC) 5 MG tablet Take 5 mg by mouth daily.     Marland Kitchen dexamethasone (DECADRON) 4 MG tablet Take 1 tablet (4 mg total) by mouth daily. 30 tablet 1  . fluticasone furoate-vilanterol (BREO ELLIPTA) 200-25 MCG/INH AEPB Inhale 1 puff into the lungs daily. 60 each 5  . gabapentin (NEURONTIN)  100 MG capsule Take 100 mg by mouth 3 (three) times daily.    Marland Kitchen lidocaine-prilocaine (EMLA) cream Apply to affected area once 30 g 3  . LUMIGAN 0.01 % SOLN Place 1 drop into both eyes at bedtime.    . ondansetron (ZOFRAN) 8 MG tablet Take 1 tablet (8 mg total) by mouth 2 (two) times daily as needed for refractory nausea / vomiting. Start on day 3 after chemo. 30  tablet 1  . pantoprazole (PROTONIX) 40 MG tablet Take 40 mg by mouth daily.    . prochlorperazine (COMPAZINE) 10 MG tablet Take 1 tablet (10 mg total) by mouth every 6 (six) hours as needed (Nausea or vomiting). 30 tablet 1  . sertraline (ZOLOFT) 25 MG tablet Take 25 mg by mouth daily.    Marland Kitchen umeclidinium bromide (INCRUSE ELLIPTA) 62.5 MCG/INH AEPB Inhale 1 puff into the lungs daily. 30 each 5   No current facility-administered medications for this visit.     OBJECTIVE: There were no vitals filed for this visit.   There is no height or weight on file to calculate BMI.    ECOG FS:2 - Symptomatic, <50% confined to bed  General: Well-developed, well-nourished, no acute distress. Sitting in a wheelchair. Eyes: Pink conjunctiva, anicteric sclera. Lungs: Clear to auscultation bilaterally. Heart: Regular rate and rhythm. No rubs, murmurs, or gallops. Abdomen: Soft, nontender, nondistended. No organomegaly noted, normoactive bowel sounds. Musculoskeletal: No edema, cyanosis, or clubbing. Neuro: Alert, answering all questions appropriately. Cranial nerves grossly intact. Skin: No rashes or petechiae noted. Psych: Flat affect.   LAB RESULTS:  Lab Results  Component Value Date   NA 135 03/27/2016   K 4.2 03/27/2016   CL 106 03/27/2016   CO2 22 03/27/2016   GLUCOSE 127 (H) 03/27/2016   BUN 45 (H) 03/27/2016   CREATININE 1.17 03/27/2016   CALCIUM 9.4 03/27/2016   PROT 6.1 (L) 03/27/2016   ALBUMIN 3.1 (L) 03/27/2016   AST 23 03/27/2016   ALT 41 03/27/2016   ALKPHOS 71 03/27/2016   BILITOT 0.7 03/27/2016   GFRNONAA 59 (L) 03/27/2016   GFRAA >60 03/27/2016    Lab Results  Component Value Date   WBC 11.7 (H) 03/27/2016   NEUTROABS 10.1 (H) 03/27/2016   HGB 11.1 (L) 03/27/2016   HCT 32.3 (L) 03/27/2016   MCV 87.8 03/27/2016   PLT 213 03/27/2016     STUDIES: Ct Chest W Contrast  Result Date: 03/05/2016 CLINICAL DATA:  Followup right lower lobe non-small cell lung carcinoma.  Worsening shortness of breath for 2 weeks. EXAM: CT CHEST WITH CONTRAST TECHNIQUE: Multidetector CT imaging of the chest was performed during intravenous contrast administration. CONTRAST:  66m ISOVUE-300 IOPAMIDOL (ISOVUE-300) INJECTION 61% COMPARISON:  PET-CT on 01/05/2016 FINDINGS: Cardiovascular: No acute findings. Coronary artery calcification. Mixed calcified and noncalcified aortic atherosclerosis. Mediastinum/Nodes: Subcarinal mediastinal lymphadenopathy measures 1.9 cm in short axis on image 86/8, without significant change since prior study. Right hilar lymphadenopathy measuring 2.5 cm on image 91/8 is also stable since previous study. No new areas of lymphadenopathy identified within the thorax. Lungs/Pleura: Tiny right pleural effusion has decreased in size since previous study. Large centrally necrotic mass in the right lower lobe has increased in size since previous study, currently measuring 9.9 x 9.5 cm compared to 8.5 x 7.9 cm previously. Moderate emphysema again noted. New mild patchy areas of airspace opacity are seen in the posterior left lower lobe, suspicious for infectious or inflammatory process. Small left lower lobe pulmonary nodules measuring 5  mm on image 121/4 and 131/4 remains stable compared to previous study. 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 Infrarenal abdominal aortic aneurysm is incompletely visualized on this study. Musculoskeletal:  No suspicious bone lesions. IMPRESSION: Mildly increased size of centrally necrotic right lower lobe mass. 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 Decrease in size of tiny right pleural effusion. Stable right hilar and mediastinal lymphadenopathy. Stable sub-cm left lower lobe pulmonary nodules. New mild patchy airspace opacities in posterior left lower lobe, suspicious for infectious or inflammatory process. Recommend continued attention on follow-up CT. Moderate emphysema.  Electronically Signed   By: Earle Gell M.D.   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  PLAN:   1.  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 MRI the brain revealed 6 individual sites of metastatic disease. Patient has now completed XRT to his brain. Case discussed with radiation oncology. Patient's palliative XRT to his chest was delayed and started today, 03/27/16. Proceed with cycle 3 of weekly low dose carboplatin and Taxol.  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 Return to clinic in 1 week for consideration of cycle 4.  2. Depression: Patient previously admitted suicidal ideation, but none recently. He was offered antidepressants but has continued to decline. Continue to monitor closely now the patient has known brain metastasis. 3. Leukocytosis: Likely secondary to Decadron for brain metastasis. Monitor. 4. Hypertension: Patient's blood pressure is within normal limits today. Monitor. 5. Shortness of breath: Stable. 6. Fatigue/Weakness: Continue with 4 mg Decadron daily.  7. Reduced appetite: Referral to nutritionist to assist with dietary intake.  Patient expressed understanding and was in agreement with this plan. He also understands that He can call clinic at any time with any questions, concerns, or complaints.   Cancer of lower lobe of right lung [facility])   Staging form: Lung, AJCC 7th Edition   - Clinical stage from 01/29/2016: Stage IV (T3, N2, M1b) - Signed by Lloyd Huger, MD on 01/29/2016   Lucendia Herrlich, NP  04/01/16 9:37 PM   Patient was seen and evaluated independently and I agree with the assessment and plan as dictated above. By report,  patient is also having problems and home. Home health was ordered, but patient refused to let the nurse in.  Lloyd Huger, MD 04/01/16 9:37 PM

## 2016-04-02 ENCOUNTER — Ambulatory Visit
Admission: RE | Admit: 2016-04-02 | Discharge: 2016-04-02 | Disposition: A | Discharge status: Home / Self Care | Payer: Medicare Other | Source: Ambulatory Visit | Attending: Radiation Oncology | Admitting: Radiation Oncology

## 2016-04-02 DIAGNOSIS — C7931 Secondary malignant neoplasm of brain: Secondary | ICD-10-CM | POA: Diagnosis not present

## 2016-04-02 DIAGNOSIS — C3431 Malignant neoplasm of lower lobe, right bronchus or lung: Secondary | ICD-10-CM | POA: Diagnosis not present

## 2016-04-02 DIAGNOSIS — Z510 Encounter for antineoplastic radiation therapy: Secondary | ICD-10-CM | POA: Diagnosis not present

## 2016-04-02 DIAGNOSIS — R5383 Other fatigue: Secondary | ICD-10-CM | POA: Diagnosis not present

## 2016-04-02 DIAGNOSIS — Z923 Personal history of irradiation: Secondary | ICD-10-CM | POA: Diagnosis not present

## 2016-04-03 ENCOUNTER — Inpatient Hospital Stay: Payer: Medicare Other

## 2016-04-03 ENCOUNTER — Ambulatory Visit: Payer: Medicare Other

## 2016-04-03 ENCOUNTER — Inpatient Hospital Stay: Payer: Medicare Other | Admitting: Oncology

## 2016-04-04 ENCOUNTER — Ambulatory Visit: Payer: Medicare Other

## 2016-04-05 ENCOUNTER — Ambulatory Visit: Payer: Medicare Other

## 2016-04-06 ENCOUNTER — Emergency Department: Payer: Medicare Other

## 2016-04-06 ENCOUNTER — Emergency Department
Admission: EM | Admit: 2016-04-06 | Discharge: 2016-04-06 | Disposition: A | Discharge status: Home / Self Care | Payer: Medicare Other | Attending: Emergency Medicine | Admitting: Emergency Medicine

## 2016-04-06 ENCOUNTER — Encounter: Payer: Self-pay | Admitting: Emergency Medicine

## 2016-04-06 DIAGNOSIS — J45909 Unspecified asthma, uncomplicated: Secondary | ICD-10-CM | POA: Insufficient documentation

## 2016-04-06 DIAGNOSIS — F17220 Nicotine dependence, chewing tobacco, uncomplicated: Secondary | ICD-10-CM | POA: Insufficient documentation

## 2016-04-06 DIAGNOSIS — R531 Weakness: Secondary | ICD-10-CM | POA: Diagnosis not present

## 2016-04-06 DIAGNOSIS — I10 Essential (primary) hypertension: Secondary | ICD-10-CM | POA: Diagnosis not present

## 2016-04-06 DIAGNOSIS — R079 Chest pain, unspecified: Secondary | ICD-10-CM | POA: Diagnosis not present

## 2016-04-06 DIAGNOSIS — Z79899 Other long term (current) drug therapy: Secondary | ICD-10-CM | POA: Diagnosis not present

## 2016-04-06 DIAGNOSIS — Z85118 Personal history of other malignant neoplasm of bronchus and lung: Secondary | ICD-10-CM | POA: Insufficient documentation

## 2016-04-06 DIAGNOSIS — M6281 Muscle weakness (generalized): Secondary | ICD-10-CM | POA: Diagnosis not present

## 2016-04-06 DIAGNOSIS — J449 Chronic obstructive pulmonary disease, unspecified: Secondary | ICD-10-CM | POA: Insufficient documentation

## 2016-04-06 DIAGNOSIS — B029 Zoster without complications: Secondary | ICD-10-CM | POA: Diagnosis not present

## 2016-04-06 LAB — BASIC METABOLIC PANEL
Anion gap: 7 (ref 5–15)
BUN: 48 mg/dL — AB (ref 6–20)
CO2: 24 mmol/L (ref 22–32)
Calcium: 9.6 mg/dL (ref 8.9–10.3)
Chloride: 106 mmol/L (ref 101–111)
Creatinine, Ser: 1.21 mg/dL (ref 0.61–1.24)
GFR calc Af Amer: >60 mL/min (ref >60)
GFR, EST NON AFRICAN AMERICAN: 56 mL/min — AB (ref >60)
GLUCOSE: 93 mg/dL (ref 65–99)
Potassium: 4 mmol/L (ref 3.5–5.1)
Sodium: 137 mmol/L (ref 135–145)

## 2016-04-06 LAB — CBC
HEMATOCRIT: 31.7 % — AB (ref 40.0–52.0)
Hemoglobin: 11 g/dL — ABNORMAL LOW (ref 13.0–18.0)
MCH: 30.5 pg (ref 26.0–34.0)
MCHC: 34.7 g/dL (ref 32.0–36.0)
MCV: 87.9 fL (ref 80.0–100.0)
Platelets: 195 10*3/uL (ref 150–440)
RBC: 3.6 MIL/uL — ABNORMAL LOW (ref 4.40–5.90)
RDW: 14.9 % — AB (ref 11.5–14.5)
WBC: 5.9 10*3/uL (ref 3.8–10.6)

## 2016-04-06 LAB — TROPONIN I: Troponin I: <0.03 ng/mL (ref <0.03)

## 2016-04-06 MED ORDER — SODIUM CHLORIDE 0.9 % IV BOLUS (SEPSIS)
1000.0000 mL | Freq: Once | INTRAVENOUS | Status: AC
  Administered 2016-04-06: 1000 mL via INTRAVENOUS

## 2016-04-06 MED ORDER — MORPHINE SULFATE (PF) 4 MG/ML IV SOLN
4.0000 mg | Freq: Once | INTRAVENOUS | Status: AC
  Administered 2016-04-06: 4 mg via INTRAVENOUS
  Filled 2016-04-06: qty 1

## 2016-04-06 MED ORDER — OXYCODONE-ACETAMINOPHEN 5-325 MG PO TABS: 1.0000 | ORAL_TABLET | Freq: Four times a day (QID) | ORAL | 0 refills | Status: AC | PRN

## 2016-04-06 MED ORDER — LEVOFLOXACIN 500 MG PO TABS: 500.0000 mg | ORAL_TABLET | Freq: Every day | ORAL | 0 refills | Status: AC

## 2016-04-06 MED ORDER — VALACYCLOVIR HCL 1 G PO TABS: 1000.0000 mg | ORAL_TABLET | Freq: Three times a day (TID) | ORAL | 0 refills | Status: AC

## 2016-04-06 MED ORDER — ONDANSETRON HCL 4 MG/2ML IJ SOLN
4.0000 mg | Freq: Once | INTRAMUSCULAR | Status: AC
  Administered 2016-04-06: 4 mg via INTRAVENOUS
  Filled 2016-04-06: qty 2

## 2016-04-06 NOTE — ED Notes (Signed)
Went in to d/c pt,  IV d/c cath intact, no bleeding, bandage applied, pt tolerated well.  Pt alert and helped o the bathroom,  Pt very weak, needed full assist.  Pt states he is having chest pain, spoke with dr p regarding pt,  Pt then states, "forget the chest pain, I just want to go home"

## 2016-04-06 NOTE — ED Notes (Signed)
Patient's family came out to states that patient wants to stay because his wife cannot care for him at home. Dr. Kerman Passey aware.

## 2016-04-06 NOTE — ED Notes (Signed)
Urinal at bedside, pt aware of need for urine

## 2016-04-06 NOTE — ED Provider Notes (Signed)
[facility] Emergency Department Provider Note  Time seen: 10:15 AM  I have reviewed the triage vital signs and the nursing notes.   HISTORY  Chief Complaint Weakness    HPI James Faulkner. is a 76 y.o. male with a past medical history of COPD, hypertension, lung cancer, with metastatic disease, who presents to the emergency department for right-sided chest discomfort, and generalized weakness.Patient currently on radiation and chemotherapy treatments for metastatic cancer including brain metastases. Patient states extreme weakness over the past several weeks. He is also complaining of right-sided chest pain and discomfort for the past 3 weeks as well. Patient has been using hydrocortisone cream on the chest with some relief. Patient denies any abdominal pain. States some nausea and decreased appetite but denies vomiting. Denies diarrhea. Denies dysuria.  Past Medical History:  Diagnosis Date  . Arthritis   . Asthma   . BPH (benign prostatic hyperplasia)   . COPD (chronic obstructive pulmonary disease) ([place])   . Cough    chronic  . GERD (gastroesophageal reflux disease)    h/o bleeding ulcer  . Hypertension   . Lung cancer [facility])     Patient Active Problem List   Diagnosis Date Noted  . 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 Tobacco dependence 01/23/2016  . AAA (abdominal aortic aneurysm) without rupture ([place]) 01/23/2016  . Right lower lobe lung mass 01/15/2016    Past Surgical History:  Procedure Laterality Date  . APPENDECTOMY    . CATARACT EXTRACTION W/PHACO Right 03/06/2015   Procedure: CATARACT EXTRACTION PHACO AND INTRAOCULAR LENS PLACEMENT ([place]);  Surgeon: Estill Cotta, MD;  Location: [facility] ORS;  Service: Ophthalmology;  Laterality: Right;  [place] 02:02 AP% 26.6 CDE 64.53 fluid pack lot # [id number]  . EYE SURGERY    . TONSILLECTOMY      Prior to Admission medications   Medication Sig Start Date End Date Taking?  Authorizing Provider  amLODipine (NORVASC) 5 MG tablet Take 5 mg by mouth daily.  01/12/16   Historical Provider, MD  dexamethasone (DECADRON) 4 MG tablet Take 1 tablet (4 mg total) by mouth daily. 03/06/16   Lloyd Huger, MD  fluticasone furoate-vilanterol (BREO ELLIPTA) 200-25 MCG/INH AEPB Inhale 1 puff into the lungs daily. 01/17/16   Flora Lipps, MD  gabapentin (NEURONTIN) 100 MG capsule Take 100 mg by mouth 3 (three) times daily.    Historical Provider, MD  lidocaine-prilocaine (EMLA) cream Apply to affected area once 02/26/16   Lloyd Huger, MD  LUMIGAN 0.01 % SOLN Place 1 drop into both eyes at bedtime. 10/20/15   Historical Provider, MD  ondansetron (ZOFRAN) 8 MG tablet Take 1 tablet (8 mg total) by mouth 2 (two) times daily as needed for refractory nausea / vomiting. Start on day 3 after chemo. 02/26/16   Lloyd Huger, MD  pantoprazole (PROTONIX) 40 MG tablet Take 40 mg by mouth daily.    Historical Provider, MD  prochlorperazine (COMPAZINE) 10 MG tablet Take 1 tablet (10 mg total) by mouth every 6 (six) hours as needed (Nausea or vomiting). 02/26/16   Lloyd Huger, MD  sertraline (ZOLOFT) 25 MG tablet Take 25 mg by mouth daily. 10/23/15   Historical Provider, MD  umeclidinium bromide (INCRUSE ELLIPTA) 62.5 MCG/INH AEPB Inhale 1 puff into the lungs daily. 01/17/16   Flora Lipps, MD    Allergies  Allergen Reactions  . Sulfa Antibiotics Swelling and Rash    History reviewed. No pertinent family history.  Social History Social History  Substance Use Topics  . Smoking status: Current Every Day Smoker    Packs/day: 1.00    Years: 60.00  . Smokeless tobacco: Current User    Types: Chew  . Alcohol use Yes    Review of Systems Constitutional: Negative for fever. Cardiovascular: Right-sided chest pain/rash. Respiratory: Negative for shortness of breath. Gastrointestinal: Negative for abdominal pain Musculoskeletal: Negative for back pain. Skin: Rash across  right back and chest. Neurological: Negative for headache 10-point ROS otherwise negative.  ____________________________________________   PHYSICAL EXAM:  VITAL SIGNS: ED Triage Vitals  Enc Vitals Group     BP 04/06/16 0908 128/78     Pulse Rate 04/06/16 0908 81     Resp 04/06/16 0908 (!) 21     Temp 04/06/16 0911 97.8 F (36.6 C)     Temp Source 04/06/16 0911 Oral     SpO2 04/06/16 0907 98 %     Weight 04/06/16 0909 143 lb 4.8 oz (65 kg)     Height 04/06/16 0909 '5\' 8"'$  (1.727 m)     Head Circumference --      Peak Flow --      Pain Score 04/06/16 0909 5     Pain Loc --      Pain Edu? --      Excl. in [place]? --    Constitutional: Alert. Well appearing and in no distress.Does appear somewhat weak. Eyes: Normal exam ENT   Head: Normocephalic and atraumatic.   Mouth/Throat: Mucous membranes are moist. Cardiovascular: Normal rate, regular rhythm.  Respiratory: Normal respiratory effort without tachypnea nor retractions. Breath sounds are clear  Gastrointestinal: Soft and nontender. No distention Musculoskeletal: Nontender with normal range of motion in all extremities.  Neurologic:  Normal speech and language. No gross focal neurologic deficits are appreciated. Skin:  Skin is warm, dry.. Patient has a vesicular/scabbed over rash over the right chest and right back. Does not cross midline. Very consistent with shingles. Psychiatric: Mood and affect are normal.  ____________________________________________     RADIOLOGY  Chest x-ray shows new patchy densities in the left midlung  ____________________________________________   INITIAL IMPRESSION / ASSESSMENT AND PLAN / ED COURSE  Pertinent labs & imaging results that were available during my care of the patient were reviewed by me and considered in my medical decision making (see chart for details).  The patient presents to the emergency department with generalized weakness and right-sided chest discomfort with a  rash consistent with shingles. We will check labs, chest x-ray, urinalysis and closely monitor in the emergency department.  Overall the patient appears well. Labs are largely within normal limits. Troponin negative. Chest x-ray does show patchy densities in the left midlung however the patient is also currently receiving radiation treatments it is unclear at this time whether the x-ray findings reflects scarring from the radiation or developing pneumonia. Given the new findings we will cover with a short course of antibiotics. Patient does appear to have shingles, we will cover with valacyclovir and a short course of pain medication. I discussed the findings with the patient as well as his brother-in-law as well as our recommendation to follow-up with Dr. Grayland Ormond Monday or Tuesday.  ____________________________________________   FINAL CLINICAL IMPRESSION(S) / ED DIAGNOSES  Generalized weakness Shingles    Harvest Dark, MD 04/06/16 1148

## 2016-04-06 NOTE — ED Notes (Signed)
Patient told this writer that he is not having chest pains and wants to go home. Dr. Kerman Passey is aware.

## 2016-04-06 NOTE — Discharge Instructions (Signed)
Please call your oncologist Monday to inform them of today's ER visit. Please follow-up this week for recheck/reevaluation. Return to the emergency department for any worsening symptoms, or any symptom personally concerning to yourself.

## 2016-04-06 NOTE — ED Triage Notes (Signed)
Pt has pain across entire chest from rash. He reports there 3 months and gets better with hydrocortisone cream. Also c/o continuing weakness over last month. Cannot eat because so weak per pt. Has lung, brain, kidney CA that he is getting RT and chemo for.

## 2016-04-08 ENCOUNTER — Ambulatory Visit: Payer: Medicare Other

## 2016-04-09 ENCOUNTER — Telehealth: Payer: Self-pay | Admitting: *Deleted

## 2016-04-09 ENCOUNTER — Ambulatory Visit: Payer: Medicare Other

## 2016-04-09 NOTE — Telephone Encounter (Signed)
[facility] called back and said he was agreeable and they will go out to open him to services

## 2016-04-09 NOTE — Telephone Encounter (Signed)
[facility] services were ordered last week and pt refused those services.

## 2016-04-09 NOTE — Telephone Encounter (Signed)
Contacted [facility] and they will contact the patient to see if he is agreeable to services now

## 2016-04-09 NOTE — Telephone Encounter (Signed)
Requesting home health or hospice as he is unable to get up now and needs help. Please call to discuss. [facility] is not on his release of information list

## 2016-04-10 ENCOUNTER — Inpatient Hospital Stay: Payer: Medicare Other

## 2016-04-10 ENCOUNTER — Inpatient Hospital Stay: Payer: Medicare Other | Admitting: Oncology

## 2016-04-10 ENCOUNTER — Ambulatory Visit: Payer: Medicare Other

## 2016-04-10 DIAGNOSIS — B029 Zoster without complications: Secondary | ICD-10-CM | POA: Diagnosis not present

## 2016-04-10 DIAGNOSIS — J189 Pneumonia, unspecified organism: Secondary | ICD-10-CM | POA: Diagnosis not present

## 2016-04-10 DIAGNOSIS — C7931 Secondary malignant neoplasm of brain: Secondary | ICD-10-CM | POA: Diagnosis not present

## 2016-04-10 DIAGNOSIS — I10 Essential (primary) hypertension: Secondary | ICD-10-CM | POA: Diagnosis not present

## 2016-04-10 DIAGNOSIS — C3431 Malignant neoplasm of lower lobe, right bronchus or lung: Secondary | ICD-10-CM | POA: Diagnosis not present

## 2016-04-10 DIAGNOSIS — J449 Chronic obstructive pulmonary disease, unspecified: Secondary | ICD-10-CM | POA: Diagnosis not present

## 2016-04-11 ENCOUNTER — Ambulatory Visit: Payer: Medicare Other

## 2016-04-12 ENCOUNTER — Telehealth: Payer: Self-pay | Admitting: *Deleted

## 2016-04-12 ENCOUNTER — Ambulatory Visit: Payer: Medicare Other

## 2016-04-12 NOTE — Telephone Encounter (Signed)
Patient wants to transition to Hospice. Asking for referral to be faxed over [phone]

## 2016-04-12 NOTE — Telephone Encounter (Signed)
I will fax referral to hospice.

## 2016-04-12 NOTE — Telephone Encounter (Signed)
Ok, that is appropriate.

## 2016-04-14 ENCOUNTER — Other Ambulatory Visit: Payer: Self-pay | Admitting: Oncology

## 2016-04-14 MED ORDER — MORPHINE SULFATE (CONCENTRATE) 20 MG/ML PO SOLN: 20.0000 mg | ORAL | 0 refills | Status: AC | PRN

## 2016-04-14 MED ORDER — LORAZEPAM 0.5 MG PO TABS: 0.5000 mg | ORAL_TABLET | ORAL | 0 refills | Status: AC | PRN

## 2016-04-15 ENCOUNTER — Ambulatory Visit: Payer: Medicare Other

## 2016-04-16 ENCOUNTER — Ambulatory Visit: Payer: Medicare Other

## 2016-04-17 ENCOUNTER — Ambulatory Visit: Payer: Medicare Other

## 2016-04-17 ENCOUNTER — Inpatient Hospital Stay: Payer: Medicare Other

## 2016-04-17 ENCOUNTER — Inpatient Hospital Stay: Payer: Medicare Other | Admitting: Oncology

## 2016-04-18 ENCOUNTER — Ambulatory Visit: Payer: Medicare Other

## 2016-04-18 ENCOUNTER — Telehealth: Payer: Self-pay | Admitting: *Deleted

## 2016-04-19 ENCOUNTER — Ambulatory Visit: Payer: Medicare Other

## 2016-04-22 ENCOUNTER — Ambulatory Visit: Payer: Medicare Other

## 2016-04-23 ENCOUNTER — Ambulatory Visit: Payer: Medicare Other

## 2016-04-24 ENCOUNTER — Ambulatory Visit: Payer: Medicare Other

## 2016-04-26 ENCOUNTER — Ambulatory Visit (INDEPENDENT_AMBULATORY_CARE_PROVIDER_SITE_OTHER): Payer: Medicare Other | Admitting: Vascular Surgery

## 2016-04-26 ENCOUNTER — Other Ambulatory Visit (INDEPENDENT_AMBULATORY_CARE_PROVIDER_SITE_OTHER): Payer: Medicare Other

## 2016-05-06 ENCOUNTER — Ambulatory Visit: Payer: Self-pay | Admitting: Radiation Oncology

## 2016-05-16 NOTE — Telephone Encounter (Signed)
Called to report that patient expired this morning and was pronounced at 9:41 AM

## 2016-05-16 DEATH — deceased

## 2016-07-01 ENCOUNTER — Other Ambulatory Visit: Payer: Self-pay | Admitting: Nurse Practitioner

## 2017-10-29 IMAGING — DX DG CHEST 1V
1 series · 1 of 1 positions shown · non-contrast
Comparison: 01/01/2016

CLINICAL DATA: Status post lung biopsy.

EXAM:
CHEST 1 VIEW

[chest ap]
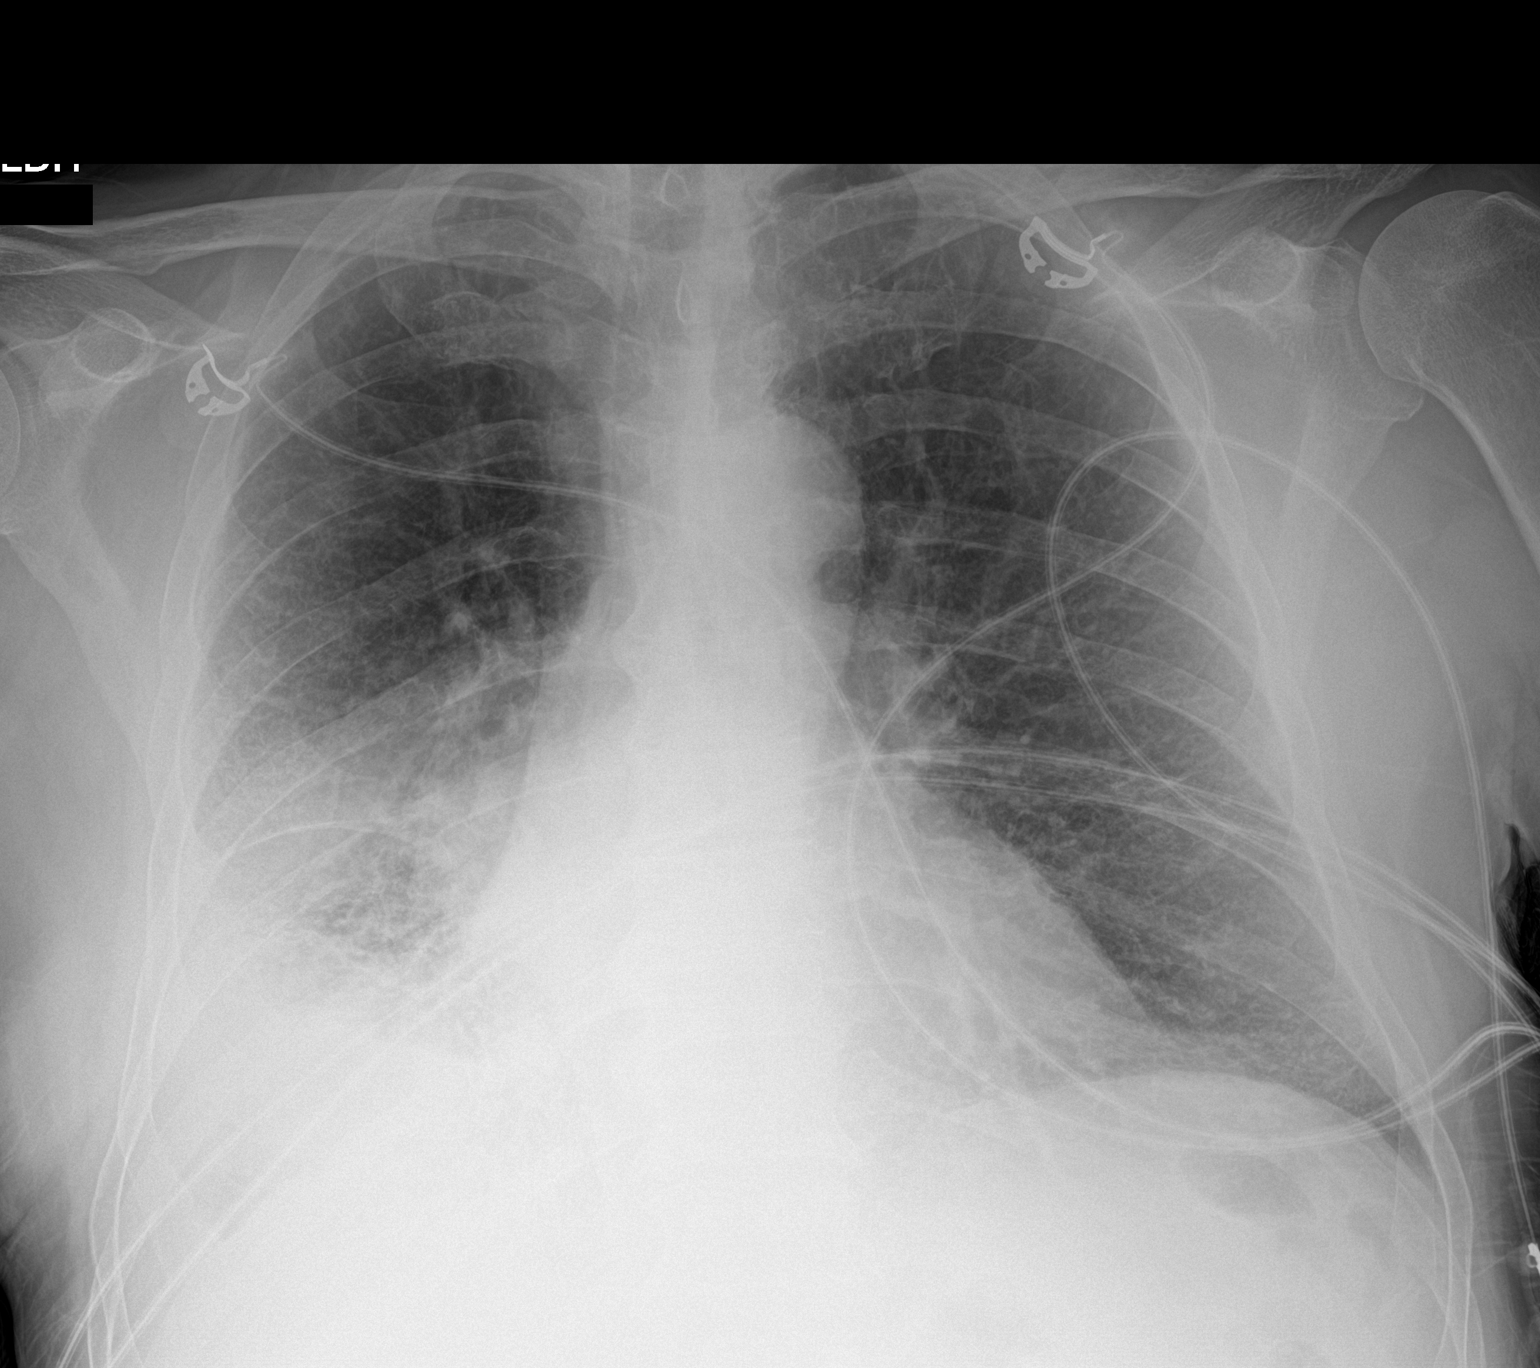

[1 of 1 positions shown; findings below may reference images not displayed]

FINDINGS: Normal heart size. There is no left pleural effusion. The left lung
is clear. Right pleural effusion and masslike consolidation of the
right lower lobe is again noted. No pneumothorax visible status post
percutaneous biopsy of right lung mass.
IMPRESSION: 1. No complication status post right lung biopsy.

## 2017-10-29 IMAGING — CT CT BIOPSY
1 of 2 series · 8 of 14 positions shown, 10 images · non-contrast
Comparison: none

INDICATION: 75-year-old male with a hypermetabolic right lower lobe pulmonary
mass. CT biopsy is warranted to facilitate tissue diagnosis.

[Series 8: i-spiral 5.0 b30f · axial · 0.56mm/px · z∈[+1247,+1331]mm · 8 of 32 slices shown, 10 images]
[im 4/32  soft-tissue]
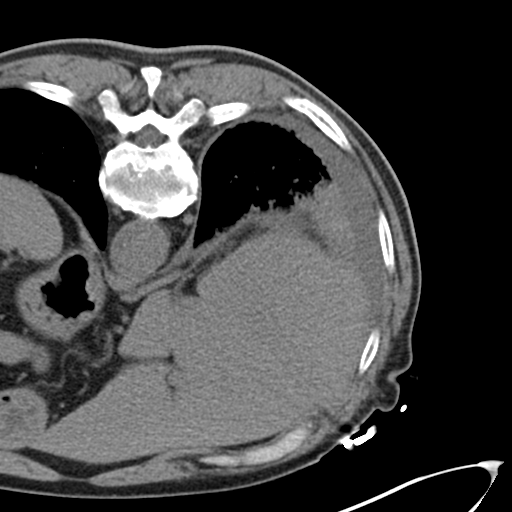
[im 4/32  bone]
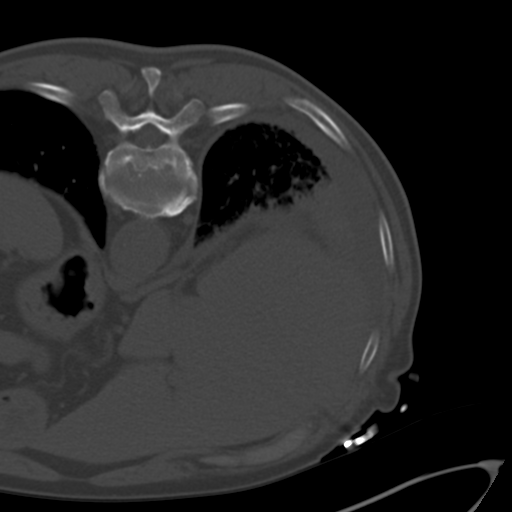
[im 7/32  bone]
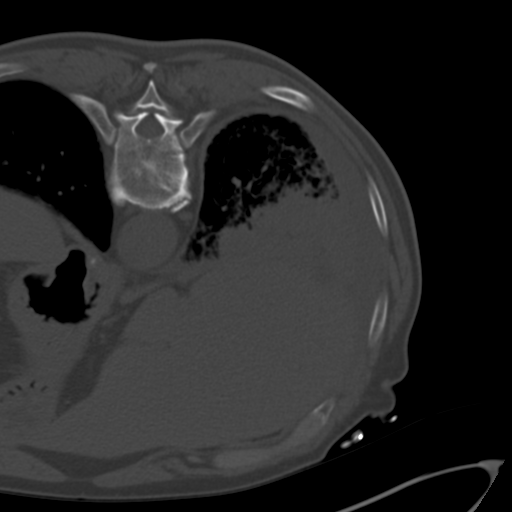
[im 11/32  bone]
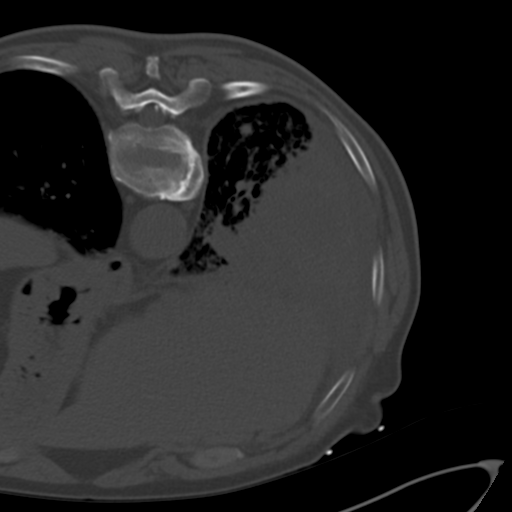
[im 14/32  bone]
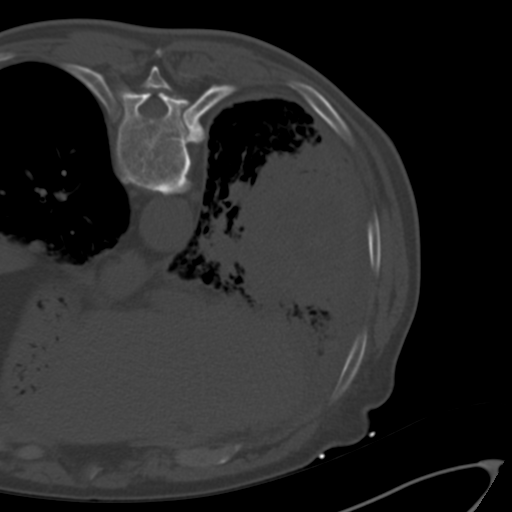
[im 18/32  soft-tissue]
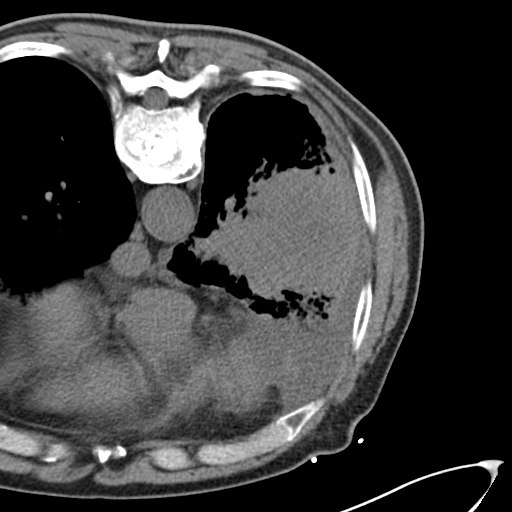
[im 18/32  bone]
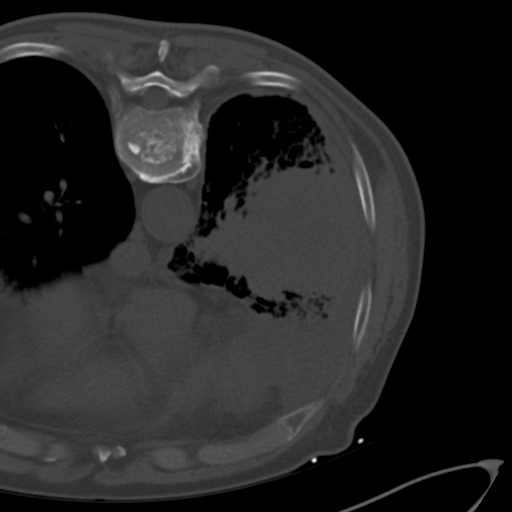
[im 21/32  bone]
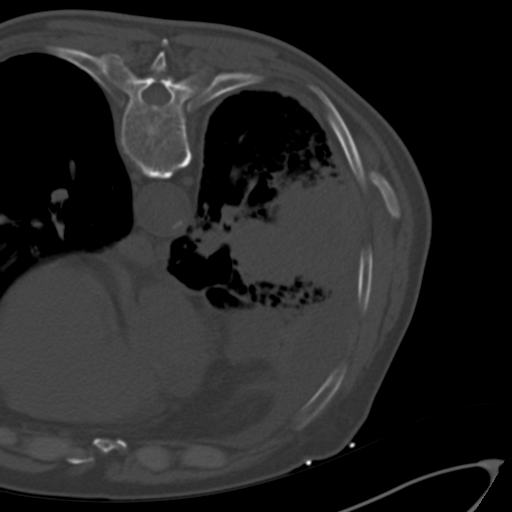
[im 25/32  bone]
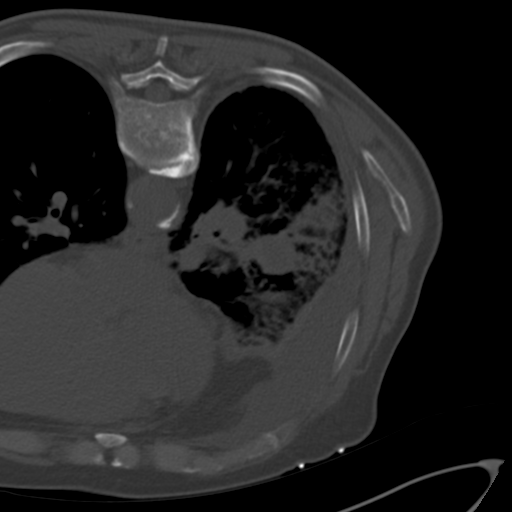
[im 28/32  bone]
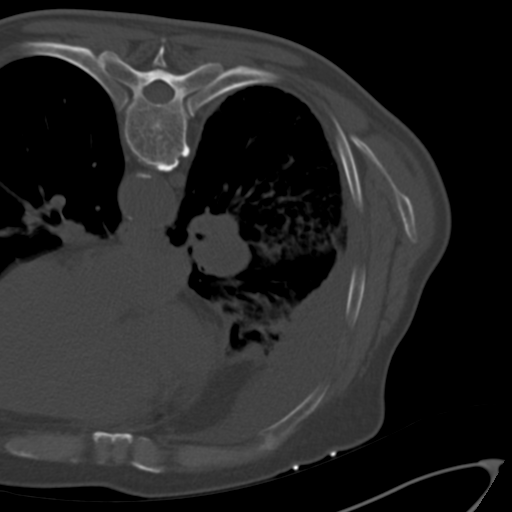

[8 of 14 positions shown; findings below may reference images not displayed]

EXAM:
CT-guided biopsy right lower lobe lobe pulmonary nodule

MEDICATIONS:
None.

ANESTHESIA/SEDATION:
Fentanyl 100 mcg IV; Versed 2 mg IV

Moderate Sedation Time:  21 minutes

The patient was continuously monitored during the procedure by the
interventional radiology nurse under my direct supervision.

FLUOROSCOPY TIME:  Fluoroscopy Time: 0 minutes 0 seconds (0 mGy).

COMPLICATIONS:
None immediate.

Estimated blood loss:  0

PROCEDURE:
Informed written consent was obtained from the patient after a
thorough discussion of the procedural risks, benefits and
alternatives. All questions were addressed. Maximal Sterile Barrier
Technique was utilized including caps, mask, sterile gowns, sterile
gloves, sterile drape, hand hygiene and skin antiseptic. A timeout
was performed prior to the initiation of the procedure.

A planning axial CT scan was performed. The mass in the right lower
lobe was successfully identified. A suitable skin entry site was
selected and marked. The region was then sterilely prepped and
draped in standard fashion using Betadine skin prep. Local
anesthesia was attained by infiltration with 1% lidocaine. A small
dermatotomy was made. Under intermittent CT fluoroscopic guidance, a
17 gauge trocar needle was advanced into the lung and positioned at
the margin of the nodule.

Multiple 18 gauge core biopsies were then coaxially obtained using
the Bio[employer] automated biopsy device. Biopsy specimens were placed
in formalin and delivered to pathology for further analysis. The
biopsy device and introducer needle were removed. Post biopsy axial
CT imaging demonstrates no evidence of immediate complication. There
is no pneumothorax. Mild perilesional alveolar hemorrhage is not
unexpected. The patient tolerated the procedure well.
IMPRESSION: Technically successful CT-guided biopsy right lower lobe pulmonary
nodule.

## 2018-01-16 IMAGING — DX DG CHEST 1V PORT
1 series · 1 of 1 positions shown · non-contrast
Comparison: 01/18/2016

CLINICAL DATA: Chest pain

EXAM:
PORTABLE CHEST 1 VIEW

[chest ap]
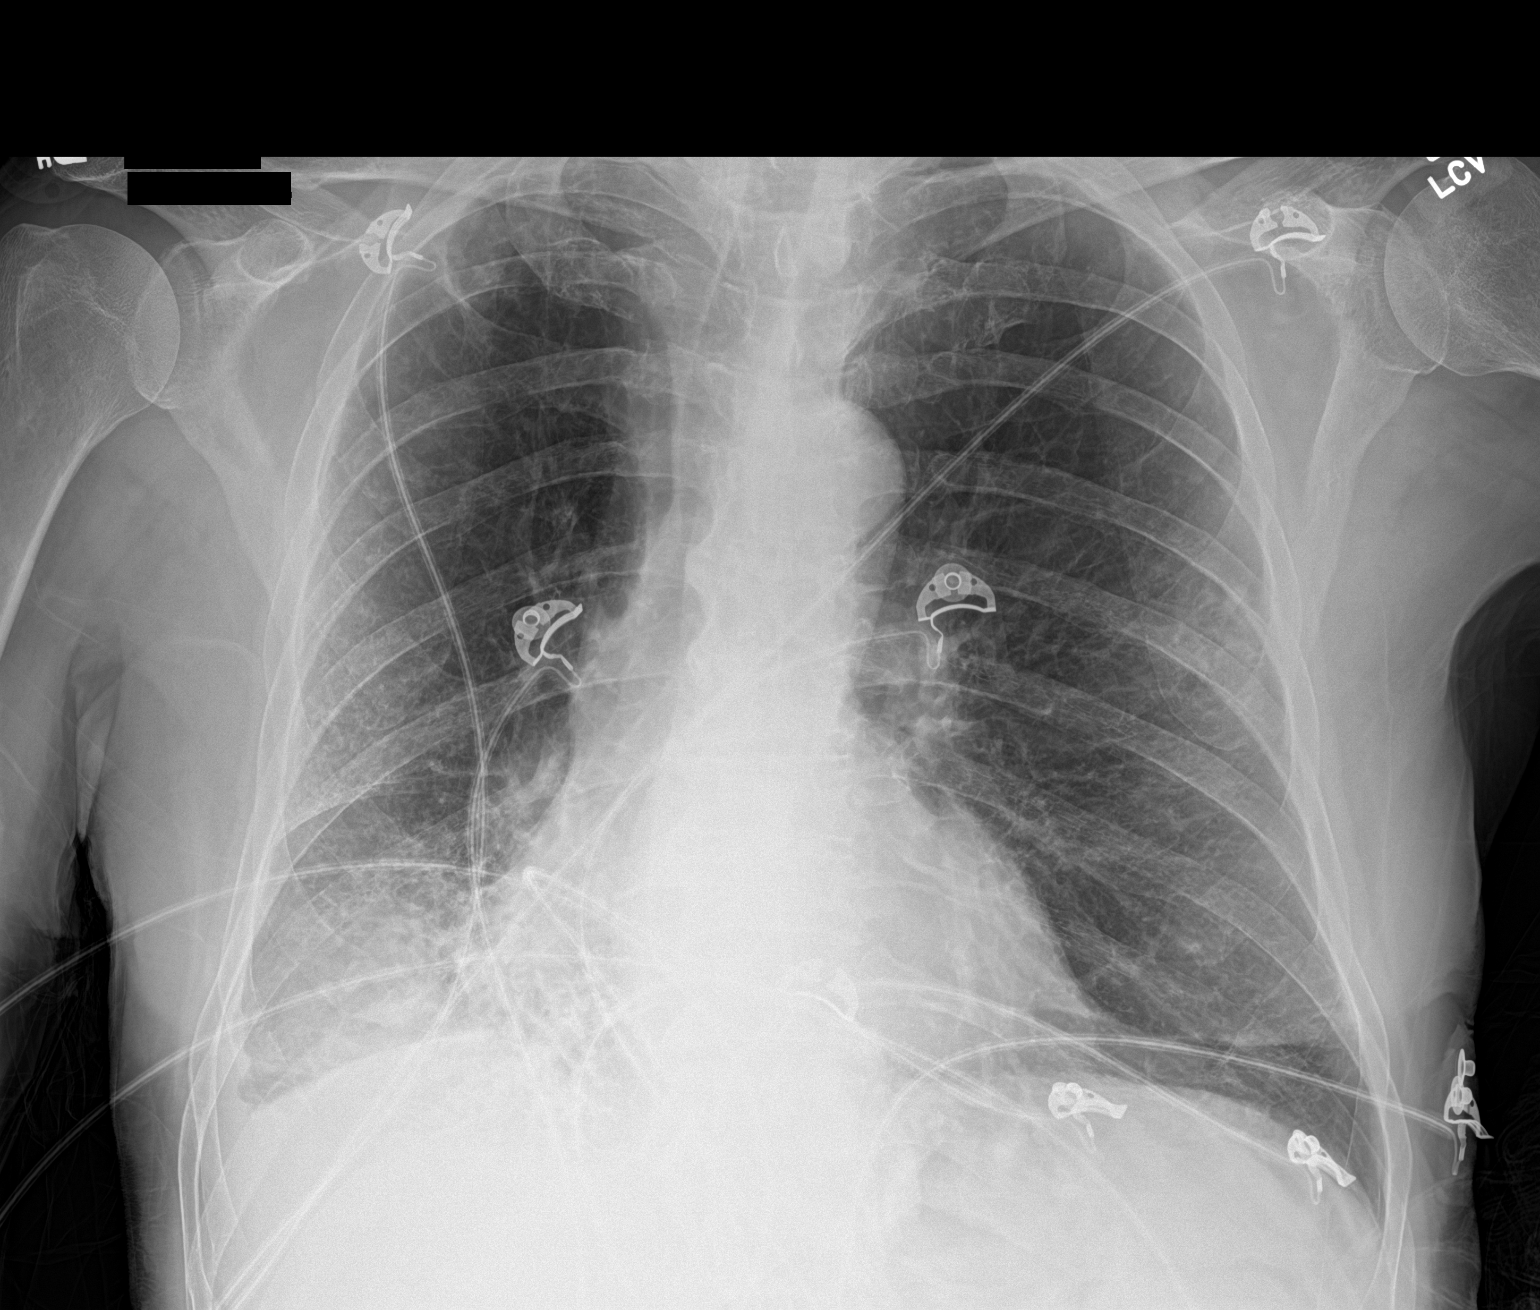

[1 of 1 positions shown; findings below may reference images not displayed]

FINDINGS: Heterogeneous opacities at the right lung base consistent with the
patient's known lung mass and hilar adenopathy are stable. Right
pleural effusion is stable. There is increasing shift of the
mediastinum to the right suggesting increasing volume loss. There
are new ill-defined patchy densities in the lateral left mid lung
zone. No pneumothorax.
IMPRESSION: Stable findings related to right basilar lung mass.

There is increasing volume loss in the right lower lung
characterized by shift of the mediastinum to the right.

New patchy densities in the left mid lung worrisome for developing
airspace disease.
